# Patient Record
Sex: Male | Born: 1950 | Race: White | Hispanic: Yes | Smoking: Former smoker
Health system: Southern US, Community
[De-identification: ages and names within clinical notes are randomized; demographics above are authoritative.]

## PROBLEM LIST (undated history)

## (undated) DIAGNOSIS — K219 Gastro-esophageal reflux disease without esophagitis: Secondary | ICD-10-CM

## (undated) DIAGNOSIS — M549 Dorsalgia, unspecified: Secondary | ICD-10-CM

## (undated) DIAGNOSIS — R7309 Other abnormal glucose: Secondary | ICD-10-CM

## (undated) DIAGNOSIS — L0292 Furuncle, unspecified: Secondary | ICD-10-CM

## (undated) DIAGNOSIS — IMO0002 Reserved for concepts with insufficient information to code with codable children: Secondary | ICD-10-CM

## (undated) DIAGNOSIS — L0293 Carbuncle, unspecified: Secondary | ICD-10-CM

## (undated) DIAGNOSIS — J309 Allergic rhinitis, unspecified: Secondary | ICD-10-CM

## (undated) DIAGNOSIS — E785 Hyperlipidemia, unspecified: Secondary | ICD-10-CM

## (undated) DIAGNOSIS — E291 Testicular hypofunction: Secondary | ICD-10-CM

## (undated) DIAGNOSIS — G4733 Obstructive sleep apnea (adult) (pediatric): Secondary | ICD-10-CM

## (undated) DIAGNOSIS — E669 Obesity, unspecified: Secondary | ICD-10-CM

## (undated) DIAGNOSIS — E119 Type 2 diabetes mellitus without complications: Secondary | ICD-10-CM

## (undated) DIAGNOSIS — G4710 Hypersomnia, unspecified: Secondary | ICD-10-CM

## (undated) DIAGNOSIS — I10 Essential (primary) hypertension: Secondary | ICD-10-CM

## (undated) DIAGNOSIS — R05 Cough: Secondary | ICD-10-CM

## (undated) DIAGNOSIS — K645 Perianal venous thrombosis: Secondary | ICD-10-CM

## (undated) DIAGNOSIS — R7302 Impaired glucose tolerance (oral): Secondary | ICD-10-CM

## (undated) DIAGNOSIS — Z86010 Personal history of colonic polyps: Secondary | ICD-10-CM

## (undated) HISTORY — DX: Obesity, unspecified: E66.9

## (undated) HISTORY — DX: Type 2 diabetes mellitus without complications: E11.9

## (undated) HISTORY — DX: Furuncle, unspecified: L02.92

## (undated) HISTORY — DX: Gastro-esophageal reflux disease without esophagitis: K21.9

## (undated) HISTORY — DX: Carbuncle, unspecified: L02.93

## (undated) HISTORY — DX: Personal history of colonic polyps: Z86.010

## (undated) HISTORY — DX: Hyperlipidemia, unspecified: E78.5

## (undated) HISTORY — DX: Allergic rhinitis, unspecified: J30.9

## (undated) HISTORY — DX: Other abnormal glucose: R73.09

## (undated) HISTORY — DX: Testicular hypofunction: E29.1

## (undated) HISTORY — DX: Essential (primary) hypertension: I10

## (undated) HISTORY — DX: Obstructive sleep apnea (adult) (pediatric): G47.33

## (undated) HISTORY — DX: Hypersomnia, unspecified: G47.10

## (undated) HISTORY — DX: Perianal venous thrombosis: K64.5

## (undated) HISTORY — DX: Cough: R05

## (undated) HISTORY — DX: Dorsalgia, unspecified: M54.9

## (undated) HISTORY — DX: Impaired glucose tolerance (oral): R73.02

## (undated) HISTORY — DX: Reserved for concepts with insufficient information to code with codable children: IMO0002

---

## 2007-01-03 ENCOUNTER — Emergency Department (HOSPITAL_COMMUNITY): Admission: EM | Admit: 2007-01-03 | Discharge: 2007-01-03 | Payer: Self-pay | Admitting: Emergency Medicine

## 2007-02-01 ENCOUNTER — Ambulatory Visit: Payer: Self-pay | Admitting: Internal Medicine

## 2007-02-02 ENCOUNTER — Ambulatory Visit: Payer: Self-pay | Admitting: Cardiovascular Disease

## 2007-02-03 ENCOUNTER — Ambulatory Visit: Payer: Self-pay | Admitting: Internal Medicine

## 2007-02-03 LAB — CONVERTED CEMR LAB
ALT: 38 units/L (ref 0–40)
AST: 31 units/L (ref 0–37)
Albumin: 4.1 g/dL (ref 3.5–5.2)
BUN: 9 mg/dL (ref 6–23)
Basophils Absolute: 0 10*3/uL (ref 0.0–0.1)
Basophils Relative: 0 % (ref 0.0–1.0)
CO2: 31 meq/L (ref 19–32)
Calcium: 9.3 mg/dL (ref 8.4–10.5)
Chloride: 104 meq/L (ref 96–112)
Cholesterol: 212 mg/dL (ref 0–200)
Creatinine, Ser: 1 mg/dL (ref 0.4–1.5)
Direct LDL: 155.9 mg/dL
Eosinophils Absolute: 0.1 10*3/uL (ref 0.0–0.6)
Eosinophils Relative: 1.9 % (ref 0.0–5.0)
GFR calc Af Amer: 101 mL/min
GFR calc non Af Amer: 83 mL/min
Glucose, Bld: 114 mg/dL — ABNORMAL HIGH (ref 70–99)
HCT: 41.8 % (ref 39.0–52.0)
HDL: 48.1 mg/dL (ref >39.0)
Hemoglobin: 14.3 g/dL (ref 13.0–17.0)
Hgb A1c MFr Bld: 6.1 % — ABNORMAL HIGH (ref 4.6–6.0)
Lymphocytes Relative: 49.7 % — ABNORMAL HIGH (ref 12.0–46.0)
MCHC: 34.2 g/dL (ref 30.0–36.0)
MCV: 86.4 fL (ref 78.0–100.0)
Monocytes Absolute: 0.4 10*3/uL (ref 0.2–0.7)
Monocytes Relative: 6.9 % (ref 3.0–11.0)
Neutro Abs: 2.2 10*3/uL (ref 1.4–7.7)
Neutrophils Relative %: 41.5 % — ABNORMAL LOW (ref 43.0–77.0)
PSA: 2.24 ng/mL
PSA: 2.24 ng/mL (ref 0.10–4.00)
Platelets: 306 10*3/uL (ref 150–400)
Potassium: 4.5 meq/L (ref 3.5–5.1)
RBC: 4.84 M/uL (ref 4.22–5.81)
RDW: 12.7 % (ref 11.5–14.6)
Sodium: 140 meq/L (ref 135–145)
TSH: 1.78 microintl units/mL (ref 0.35–5.50)
Total CHOL/HDL Ratio: 4.4
Triglycerides: 91 mg/dL (ref 0–149)
VLDL: 18 mg/dL (ref 0–40)
WBC: 5.2 10*3/uL (ref 4.5–10.5)

## 2007-02-07 ENCOUNTER — Ambulatory Visit: Payer: Self-pay

## 2007-03-14 ENCOUNTER — Ambulatory Visit: Payer: Self-pay | Admitting: Internal Medicine

## 2007-04-18 ENCOUNTER — Ambulatory Visit: Payer: Self-pay | Admitting: Internal Medicine

## 2007-04-18 LAB — CONVERTED CEMR LAB
ALT: 34 units/L (ref 0–40)
AST: 30 units/L (ref 0–37)
Albumin: 4.2 g/dL (ref 3.5–5.2)
Alkaline Phosphatase: 32 units/L — ABNORMAL LOW (ref 39–117)
BUN: 16 mg/dL (ref 6–23)
Bilirubin, Direct: 0.1 mg/dL (ref 0.0–0.3)
CO2: 30 meq/L (ref 19–32)
Calcium: 9.6 mg/dL (ref 8.4–10.5)
Chloride: 107 meq/L (ref 96–112)
Cholesterol: 172 mg/dL (ref 0–200)
Creatinine, Ser: 1 mg/dL (ref 0.4–1.5)
GFR calc Af Amer: 101 mL/min
GFR calc non Af Amer: 83 mL/min
Glucose, Bld: 110 mg/dL — ABNORMAL HIGH (ref 70–99)
HDL: 42.5 mg/dL (ref >39.0)
LDL Cholesterol: 101 mg/dL — ABNORMAL HIGH (ref 0–99)
Potassium: 4.7 meq/L (ref 3.5–5.1)
Sodium: 142 meq/L (ref 135–145)
Testosterone: 249.79 ng/dL — ABNORMAL LOW (ref 350.00–890)
Total Bilirubin: 0.8 mg/dL (ref 0.3–1.2)
Total CHOL/HDL Ratio: 4
Total Protein: 6.9 g/dL (ref 6.0–8.3)
Triglycerides: 145 mg/dL (ref 0–149)
VLDL: 29 mg/dL (ref 0–40)

## 2007-04-25 ENCOUNTER — Ambulatory Visit: Payer: Self-pay | Admitting: Internal Medicine

## 2007-06-08 ENCOUNTER — Ambulatory Visit: Payer: Self-pay | Admitting: Internal Medicine

## 2007-06-08 LAB — CONVERTED CEMR LAB
BUN: 13 mg/dL (ref 6–23)
CO2: 27 meq/L (ref 19–32)
Calcium: 9.6 mg/dL (ref 8.4–10.5)
Chloride: 105 meq/L (ref 96–112)
Creatinine, Ser: 1 mg/dL (ref 0.4–1.5)
GFR calc Af Amer: 101 mL/min
GFR calc non Af Amer: 83 mL/min
Glucose, Bld: 103 mg/dL — ABNORMAL HIGH (ref 70–99)
Potassium: 4.4 meq/L (ref 3.5–5.1)
Sex Hormone Binding: 12 nmol/L — ABNORMAL LOW (ref 13–71)
Sodium: 141 meq/L (ref 135–145)
Testosterone Free: 81.1 pg/mL (ref 47.0–244.0)
Testosterone-% Free: 3 % — ABNORMAL HIGH (ref 1.6–2.9)
Testosterone: 266.35 ng/dL — ABNORMAL LOW (ref 350–890)

## 2007-06-13 ENCOUNTER — Ambulatory Visit: Payer: Self-pay | Admitting: Internal Medicine

## 2007-07-14 ENCOUNTER — Ambulatory Visit: Payer: Self-pay | Admitting: Internal Medicine

## 2007-11-22 ENCOUNTER — Ambulatory Visit: Payer: Self-pay | Admitting: Internal Medicine

## 2007-11-22 DIAGNOSIS — R7309 Other abnormal glucose: Secondary | ICD-10-CM | POA: Insufficient documentation

## 2007-11-22 HISTORY — DX: Other abnormal glucose: R73.09

## 2007-11-22 LAB — CONVERTED CEMR LAB
ALT: 40 units/L (ref 0–53)
AST: 33 units/L (ref 0–37)
Cholesterol: 183 mg/dL (ref 0–200)
HDL: 51.5 mg/dL (ref >39.0)
Hgb A1c MFr Bld: 6.1 % — ABNORMAL HIGH (ref 4.6–6.0)
LDL Cholesterol: 102 mg/dL — ABNORMAL HIGH (ref 0–99)
Total CHOL/HDL Ratio: 3.6
Triglycerides: 148 mg/dL (ref 0–149)
VLDL: 30 mg/dL (ref 0–40)

## 2007-11-23 ENCOUNTER — Ambulatory Visit: Payer: Self-pay | Admitting: Internal Medicine

## 2007-11-23 DIAGNOSIS — E785 Hyperlipidemia, unspecified: Secondary | ICD-10-CM

## 2007-11-23 DIAGNOSIS — E669 Obesity, unspecified: Secondary | ICD-10-CM

## 2007-11-23 DIAGNOSIS — I10 Essential (primary) hypertension: Secondary | ICD-10-CM

## 2007-11-23 DIAGNOSIS — Z860100 Personal history of colon polyps, unspecified: Secondary | ICD-10-CM | POA: Insufficient documentation

## 2007-11-23 DIAGNOSIS — K219 Gastro-esophageal reflux disease without esophagitis: Secondary | ICD-10-CM | POA: Insufficient documentation

## 2007-11-23 DIAGNOSIS — F528 Other sexual dysfunction not due to a substance or known physiological condition: Secondary | ICD-10-CM

## 2007-11-23 DIAGNOSIS — L0293 Carbuncle, unspecified: Secondary | ICD-10-CM

## 2007-11-23 DIAGNOSIS — L0292 Furuncle, unspecified: Secondary | ICD-10-CM | POA: Insufficient documentation

## 2007-11-23 HISTORY — DX: Personal history of colon polyps, unspecified: Z86.0100

## 2007-11-23 HISTORY — DX: Furuncle, unspecified: L02.92

## 2007-11-23 HISTORY — DX: Personal history of colonic polyps: Z86.010

## 2007-11-23 HISTORY — DX: Gastro-esophageal reflux disease without esophagitis: K21.9

## 2007-11-23 HISTORY — DX: Hyperlipidemia, unspecified: E78.5

## 2007-11-23 HISTORY — DX: Obesity, unspecified: E66.9

## 2007-11-23 HISTORY — DX: Essential (primary) hypertension: I10

## 2008-05-09 ENCOUNTER — Ambulatory Visit: Payer: Self-pay | Admitting: Internal Medicine

## 2008-05-09 LAB — CONVERTED CEMR LAB
ALT: 40 units/L (ref 0–53)
AST: 37 units/L (ref 0–37)
Albumin: 4.5 g/dL (ref 3.5–5.2)
Alkaline Phosphatase: 37 units/L — ABNORMAL LOW (ref 39–117)
BUN: 10 mg/dL (ref 6–23)
Bilirubin, Direct: 0.1 mg/dL (ref 0.0–0.3)
CO2: 30 meq/L (ref 19–32)
Calcium: 9.8 mg/dL (ref 8.4–10.5)
Chloride: 102 meq/L (ref 96–112)
Cholesterol: 171 mg/dL (ref 0–200)
Creatinine, Ser: 0.9 mg/dL (ref 0.4–1.5)
GFR calc Af Amer: 113 mL/min
GFR calc non Af Amer: 93 mL/min
Glucose, Bld: 116 mg/dL — ABNORMAL HIGH (ref 70–99)
HDL: 52 mg/dL (ref >39.0)
Hgb A1c MFr Bld: 6 % (ref 4.6–6.0)
LDL Cholesterol: 95 mg/dL (ref 0–99)
PSA: 3.2 ng/mL (ref 0.10–4.00)
Potassium: 4.5 meq/L (ref 3.5–5.1)
Sodium: 139 meq/L (ref 135–145)
Total Bilirubin: 1 mg/dL (ref 0.3–1.2)
Total CHOL/HDL Ratio: 3.3
Total Protein: 7.3 g/dL (ref 6.0–8.3)
Triglycerides: 118 mg/dL (ref 0–149)
VLDL: 24 mg/dL (ref 0–40)

## 2008-06-01 ENCOUNTER — Ambulatory Visit: Payer: Self-pay | Admitting: Internal Medicine

## 2008-06-01 DIAGNOSIS — E119 Type 2 diabetes mellitus without complications: Secondary | ICD-10-CM

## 2008-06-01 DIAGNOSIS — R972 Elevated prostate specific antigen [PSA]: Secondary | ICD-10-CM | POA: Insufficient documentation

## 2008-06-01 HISTORY — DX: Type 2 diabetes mellitus without complications: E11.9

## 2008-06-08 ENCOUNTER — Encounter: Payer: Self-pay | Admitting: Internal Medicine

## 2008-07-11 ENCOUNTER — Telehealth (INDEPENDENT_AMBULATORY_CARE_PROVIDER_SITE_OTHER): Payer: Self-pay | Admitting: *Deleted

## 2008-08-13 ENCOUNTER — Encounter: Payer: Self-pay | Admitting: Internal Medicine

## 2008-11-30 ENCOUNTER — Encounter: Payer: Self-pay | Admitting: Internal Medicine

## 2009-01-02 ENCOUNTER — Ambulatory Visit: Payer: Self-pay | Admitting: Internal Medicine

## 2009-01-02 LAB — CONVERTED CEMR LAB
BUN: 15 mg/dL (ref 6–23)
CO2: 32 meq/L (ref 19–32)
Calcium: 10 mg/dL (ref 8.4–10.5)
Chloride: 102 meq/L (ref 96–112)
Cholesterol: 163 mg/dL (ref 0–200)
Creatinine, Ser: 0.9 mg/dL (ref 0.4–1.5)
GFR calc Af Amer: 113 mL/min
GFR calc non Af Amer: 93 mL/min
Glucose, Bld: 97 mg/dL (ref 70–99)
HDL: 57 mg/dL (ref >39.0)
Hgb A1c MFr Bld: 6.1 % — ABNORMAL HIGH (ref 4.6–6.0)
LDL Cholesterol: 87 mg/dL (ref 0–99)
Potassium: 3.8 meq/L (ref 3.5–5.1)
Sodium: 141 meq/L (ref 135–145)
Total CHOL/HDL Ratio: 2.9
Triglycerides: 94 mg/dL (ref 0–149)
VLDL: 19 mg/dL (ref 0–40)

## 2009-01-03 LAB — CONVERTED CEMR LAB

## 2009-04-11 ENCOUNTER — Telehealth: Payer: Self-pay | Admitting: Internal Medicine

## 2009-04-11 ENCOUNTER — Telehealth (INDEPENDENT_AMBULATORY_CARE_PROVIDER_SITE_OTHER): Payer: Self-pay | Admitting: *Deleted

## 2009-04-16 ENCOUNTER — Encounter: Payer: Self-pay | Admitting: Internal Medicine

## 2009-06-27 ENCOUNTER — Ambulatory Visit: Payer: Self-pay | Admitting: Internal Medicine

## 2009-06-27 LAB — CONVERTED CEMR LAB
ALT: 32 units/L (ref 0–53)
AST: 32 units/L (ref 0–37)
Albumin: 4.5 g/dL (ref 3.5–5.2)
Alkaline Phosphatase: 34 units/L — ABNORMAL LOW (ref 39–117)
BUN: 16 mg/dL (ref 6–23)
Basophils Absolute: 0 10*3/uL (ref 0.0–0.1)
Basophils Relative: 0.2 % (ref 0.0–3.0)
Bilirubin Urine: NEGATIVE
Bilirubin, Direct: 0.1 mg/dL (ref 0.0–0.3)
CO2: 29 meq/L (ref 19–32)
Calcium: 9.5 mg/dL (ref 8.4–10.5)
Chloride: 105 meq/L (ref 96–112)
Cholesterol: 150 mg/dL (ref 0–200)
Creatinine, Ser: 0.9 mg/dL (ref 0.4–1.5)
Creatinine,U: 85.7 mg/dL
Eosinophils Absolute: 0.1 10*3/uL (ref 0.0–0.7)
Eosinophils Relative: 1.4 % (ref 0.0–5.0)
GFR calc non Af Amer: 92.81 mL/min (ref >60)
Glucose, Bld: 111 mg/dL — ABNORMAL HIGH (ref 70–99)
HCT: 41 % (ref 39.0–52.0)
HDL: 50.4 mg/dL (ref >39.00)
Hemoglobin, Urine: NEGATIVE
Hemoglobin: 14.3 g/dL (ref 13.0–17.0)
Hgb A1c MFr Bld: 5.9 % (ref 4.6–6.5)
Ketones, ur: NEGATIVE mg/dL
LDL Cholesterol: 88 mg/dL (ref 0–99)
Leukocytes, UA: NEGATIVE
Lymphocytes Relative: 44.3 % (ref 12.0–46.0)
Lymphs Abs: 2.7 10*3/uL (ref 0.7–4.0)
MCHC: 34.9 g/dL (ref 30.0–36.0)
MCV: 88.6 fL (ref 78.0–100.0)
Microalb Creat Ratio: 10.5 mg/g (ref 0.0–30.0)
Microalb, Ur: 0.9 mg/dL (ref 0.0–1.9)
Monocytes Absolute: 0.4 10*3/uL (ref 0.1–1.0)
Monocytes Relative: 7.4 % (ref 3.0–12.0)
Neutro Abs: 2.8 10*3/uL (ref 1.4–7.7)
Neutrophils Relative %: 46.7 % (ref 43.0–77.0)
Nitrite: NEGATIVE
PSA: 4.34 ng/mL — ABNORMAL HIGH (ref 0.10–4.00)
Platelets: 290 10*3/uL (ref 150.0–400.0)
Potassium: 4.4 meq/L (ref 3.5–5.1)
RBC: 4.62 M/uL (ref 4.22–5.81)
RDW: 12.7 % (ref 11.5–14.6)
Sodium: 140 meq/L (ref 135–145)
Specific Gravity, Urine: 1.015 (ref 1.000–1.030)
TSH: 2.01 microintl units/mL (ref 0.35–5.50)
Testosterone: 225.94 ng/dL — ABNORMAL LOW (ref 350.00–890.00)
Total Bilirubin: 0.8 mg/dL (ref 0.3–1.2)
Total CHOL/HDL Ratio: 3
Total Protein, Urine: NEGATIVE mg/dL
Total Protein: 7.3 g/dL (ref 6.0–8.3)
Triglycerides: 59 mg/dL (ref 0.0–149.0)
Urine Glucose: NEGATIVE mg/dL
Urobilinogen, UA: 0.2 (ref 0.0–1.0)
VLDL: 11.8 mg/dL (ref 0.0–40.0)
WBC: 6 10*3/uL (ref 4.5–10.5)
pH: 7 (ref 5.0–8.0)

## 2009-07-01 ENCOUNTER — Ambulatory Visit: Payer: Self-pay | Admitting: Internal Medicine

## 2009-07-01 DIAGNOSIS — G4710 Hypersomnia, unspecified: Secondary | ICD-10-CM

## 2009-07-01 HISTORY — DX: Hypersomnia, unspecified: G47.10

## 2009-07-02 ENCOUNTER — Telehealth: Payer: Self-pay | Admitting: Internal Medicine

## 2009-07-11 ENCOUNTER — Ambulatory Visit: Payer: Self-pay | Admitting: Pulmonary Disease

## 2009-07-11 DIAGNOSIS — G4733 Obstructive sleep apnea (adult) (pediatric): Secondary | ICD-10-CM

## 2009-07-11 HISTORY — DX: Obstructive sleep apnea (adult) (pediatric): G47.33

## 2009-07-16 ENCOUNTER — Telehealth: Payer: Self-pay | Admitting: Internal Medicine

## 2009-08-05 ENCOUNTER — Encounter: Payer: Self-pay | Admitting: Pulmonary Disease

## 2009-08-05 ENCOUNTER — Ambulatory Visit (HOSPITAL_BASED_OUTPATIENT_CLINIC_OR_DEPARTMENT_OTHER): Admission: RE | Admit: 2009-08-05 | Discharge: 2009-08-05 | Payer: Self-pay | Admitting: Pulmonary Disease

## 2009-08-20 ENCOUNTER — Ambulatory Visit: Payer: Self-pay | Admitting: Pulmonary Disease

## 2009-08-21 ENCOUNTER — Telehealth (INDEPENDENT_AMBULATORY_CARE_PROVIDER_SITE_OTHER): Payer: Self-pay | Admitting: *Deleted

## 2009-09-02 ENCOUNTER — Ambulatory Visit: Payer: Self-pay | Admitting: Pulmonary Disease

## 2009-09-21 ENCOUNTER — Encounter: Payer: Self-pay | Admitting: Pulmonary Disease

## 2010-01-01 ENCOUNTER — Ambulatory Visit: Payer: Self-pay | Admitting: Internal Medicine

## 2010-01-01 LAB — CONVERTED CEMR LAB
BUN: 13 mg/dL (ref 6–23)
CO2: 31 meq/L (ref 19–32)
Calcium: 9.7 mg/dL (ref 8.4–10.5)
Chloride: 103 meq/L (ref 96–112)
Cholesterol: 191 mg/dL (ref 0–200)
Creatinine, Ser: 1 mg/dL (ref 0.4–1.5)
GFR calc non Af Amer: 82.03 mL/min (ref >60)
Glucose, Bld: 115 mg/dL — ABNORMAL HIGH (ref 70–99)
HDL: 63.2 mg/dL (ref >39.00)
Hgb A1c MFr Bld: 6.2 % (ref 4.6–6.5)
LDL Cholesterol: 107 mg/dL — ABNORMAL HIGH (ref 0–99)
Potassium: 4.8 meq/L (ref 3.5–5.1)
Sodium: 139 meq/L (ref 135–145)
Total CHOL/HDL Ratio: 3
Triglycerides: 102 mg/dL (ref 0.0–149.0)
VLDL: 20.4 mg/dL (ref 0.0–40.0)

## 2010-01-02 ENCOUNTER — Ambulatory Visit: Payer: Self-pay | Admitting: Internal Medicine

## 2010-01-02 DIAGNOSIS — R059 Cough, unspecified: Secondary | ICD-10-CM

## 2010-01-02 DIAGNOSIS — R05 Cough: Secondary | ICD-10-CM

## 2010-01-02 HISTORY — DX: Cough, unspecified: R05.9

## 2010-05-28 ENCOUNTER — Ambulatory Visit: Payer: Self-pay | Admitting: Internal Medicine

## 2010-05-28 DIAGNOSIS — M549 Dorsalgia, unspecified: Secondary | ICD-10-CM

## 2010-05-28 HISTORY — DX: Dorsalgia, unspecified: M54.9

## 2010-06-27 ENCOUNTER — Ambulatory Visit: Payer: Self-pay | Admitting: Internal Medicine

## 2010-06-27 ENCOUNTER — Encounter (INDEPENDENT_AMBULATORY_CARE_PROVIDER_SITE_OTHER): Payer: Self-pay | Admitting: *Deleted

## 2010-06-27 LAB — CONVERTED CEMR LAB
ALT: 49 units/L (ref 0–53)
AST: 45 units/L — ABNORMAL HIGH (ref 0–37)
Albumin: 4.5 g/dL (ref 3.5–5.2)
Alkaline Phosphatase: 38 units/L — ABNORMAL LOW (ref 39–117)
BUN: 11 mg/dL (ref 6–23)
Basophils Absolute: 0 10*3/uL (ref 0.0–0.1)
Basophils Relative: 0.6 % (ref 0.0–3.0)
Bilirubin Urine: NEGATIVE
Bilirubin, Direct: 0.1 mg/dL (ref 0.0–0.3)
CO2: 30 meq/L (ref 19–32)
Calcium: 9.8 mg/dL (ref 8.4–10.5)
Chloride: 101 meq/L (ref 96–112)
Cholesterol: 158 mg/dL (ref 0–200)
Creatinine, Ser: 1 mg/dL (ref 0.4–1.5)
Eosinophils Absolute: 0.1 10*3/uL (ref 0.0–0.7)
Eosinophils Relative: 1.7 % (ref 0.0–5.0)
GFR calc non Af Amer: 83.82 mL/min (ref >60)
Glucose, Bld: 101 mg/dL — ABNORMAL HIGH (ref 70–99)
HCT: 41.5 % (ref 39.0–52.0)
HDL: 50.2 mg/dL (ref >39.00)
Hemoglobin, Urine: NEGATIVE
Hemoglobin: 14.3 g/dL (ref 13.0–17.0)
Ketones, ur: NEGATIVE mg/dL
LDL Cholesterol: 85 mg/dL (ref 0–99)
Leukocytes, UA: NEGATIVE
Lymphocytes Relative: 39.3 % (ref 12.0–46.0)
Lymphs Abs: 2.5 10*3/uL (ref 0.7–4.0)
MCHC: 34.4 g/dL (ref 30.0–36.0)
MCV: 89.5 fL (ref 78.0–100.0)
Monocytes Absolute: 0.4 10*3/uL (ref 0.1–1.0)
Monocytes Relative: 6.7 % (ref 3.0–12.0)
Neutro Abs: 3.3 10*3/uL (ref 1.4–7.7)
Neutrophils Relative %: 51.7 % (ref 43.0–77.0)
Nitrite: NEGATIVE
PSA: 3.84 ng/mL (ref 0.10–4.00)
Platelets: 298 10*3/uL (ref 150.0–400.0)
Potassium: 5.2 meq/L — ABNORMAL HIGH (ref 3.5–5.1)
RBC: 4.63 M/uL (ref 4.22–5.81)
RDW: 13.3 % (ref 11.5–14.6)
Sodium: 140 meq/L (ref 135–145)
Specific Gravity, Urine: 1.01 (ref 1.000–1.030)
TSH: 1.78 microintl units/mL (ref 0.35–5.50)
Testosterone: 220.17 ng/dL — ABNORMAL LOW (ref 350.00–890.00)
Total Bilirubin: 0.8 mg/dL (ref 0.3–1.2)
Total CHOL/HDL Ratio: 3
Total Protein, Urine: NEGATIVE mg/dL
Total Protein: 7.1 g/dL (ref 6.0–8.3)
Triglycerides: 113 mg/dL (ref 0.0–149.0)
Urine Glucose: NEGATIVE mg/dL
Urobilinogen, UA: 0.2 (ref 0.0–1.0)
VLDL: 22.6 mg/dL (ref 0.0–40.0)
WBC: 6.3 10*3/uL (ref 4.5–10.5)
pH: 7 (ref 5.0–8.0)

## 2010-07-02 ENCOUNTER — Encounter: Payer: Self-pay | Admitting: Internal Medicine

## 2010-07-02 ENCOUNTER — Ambulatory Visit: Payer: Self-pay | Admitting: Internal Medicine

## 2010-07-02 DIAGNOSIS — E291 Testicular hypofunction: Secondary | ICD-10-CM

## 2010-07-02 HISTORY — DX: Testicular hypofunction: E29.1

## 2010-07-07 ENCOUNTER — Telehealth (INDEPENDENT_AMBULATORY_CARE_PROVIDER_SITE_OTHER): Payer: Self-pay | Admitting: *Deleted

## 2010-07-08 ENCOUNTER — Encounter: Payer: Self-pay | Admitting: Internal Medicine

## 2010-09-02 ENCOUNTER — Encounter: Payer: Self-pay | Admitting: Internal Medicine

## 2010-09-11 ENCOUNTER — Ambulatory Visit: Payer: Self-pay | Admitting: Internal Medicine

## 2010-09-11 DIAGNOSIS — K645 Perianal venous thrombosis: Secondary | ICD-10-CM | POA: Insufficient documentation

## 2010-09-11 HISTORY — DX: Perianal venous thrombosis: K64.5

## 2010-09-16 ENCOUNTER — Ambulatory Visit (HOSPITAL_COMMUNITY): Admission: RE | Admit: 2010-09-16 | Discharge: 2010-09-16 | Payer: Self-pay | Admitting: Internal Medicine

## 2010-09-17 ENCOUNTER — Encounter: Payer: Self-pay | Admitting: Internal Medicine

## 2010-09-17 DIAGNOSIS — IMO0002 Reserved for concepts with insufficient information to code with codable children: Secondary | ICD-10-CM

## 2010-09-17 HISTORY — DX: Reserved for concepts with insufficient information to code with codable children: IMO0002

## 2010-10-15 ENCOUNTER — Encounter: Payer: Self-pay | Admitting: Internal Medicine

## 2010-11-26 ENCOUNTER — Telehealth (INDEPENDENT_AMBULATORY_CARE_PROVIDER_SITE_OTHER): Payer: Self-pay | Admitting: *Deleted

## 2010-11-27 ENCOUNTER — Ambulatory Visit (HOSPITAL_COMMUNITY)
Admission: RE | Admit: 2010-11-27 | Discharge: 2010-11-27 | Payer: Self-pay | Source: Home / Self Care | Attending: Orthopaedic Surgery | Admitting: Orthopaedic Surgery

## 2010-12-01 LAB — BASIC METABOLIC PANEL
BUN: 9 mg/dL (ref 6–23)
CO2: 25 mEq/L (ref 19–32)
Calcium: 9.9 mg/dL (ref 8.4–10.5)
Chloride: 103 mEq/L (ref 96–112)
Creatinine, Ser: 1.07 mg/dL (ref 0.4–1.5)
GFR calc Af Amer: >60 mL/min (ref >60)
GFR calc non Af Amer: >60 mL/min (ref >60)
Glucose, Bld: 94 mg/dL (ref 70–99)
Potassium: 4.5 mEq/L (ref 3.5–5.1)
Sodium: 138 mEq/L (ref 135–145)

## 2010-12-01 LAB — CBC
HCT: 43 % (ref 39.0–52.0)
Hemoglobin: 15.1 g/dL (ref 13.0–17.0)
MCH: 29.4 pg (ref 26.0–34.0)
MCHC: 35.1 g/dL (ref 30.0–36.0)
MCV: 83.8 fL (ref 78.0–100.0)
Platelets: 298 10*3/uL (ref 150–400)
RBC: 5.13 MIL/uL (ref 4.22–5.81)
RDW: 13.3 % (ref 11.5–15.5)
WBC: 8.7 10*3/uL (ref 4.0–10.5)

## 2010-12-01 LAB — PROTIME-INR
INR: 0.99 (ref 0.00–1.49)
Prothrombin Time: 13.3 seconds (ref 11.6–15.2)

## 2010-12-01 LAB — GLUCOSE, CAPILLARY
Glucose-Capillary: 119 mg/dL — ABNORMAL HIGH (ref 70–99)
Glucose-Capillary: 122 mg/dL — ABNORMAL HIGH (ref 70–99)

## 2010-12-01 LAB — SURGICAL PCR SCREEN
MRSA, PCR: NEGATIVE
Staphylococcus aureus: NEGATIVE

## 2010-12-16 NOTE — Medication Information (Signed)
Summary: Prior Autho & Approved for Androgel/[employer]  Prior Autho & Approved for Androgel/[employer]   Imported By: Sherian Rein 07/11/2010 13:38:03  _____________________________________________________________________  External Attachment:    Type:   Image     Comment:   External Document

## 2010-12-16 NOTE — Assessment & Plan Note (Signed)
Summary: back pain/#/cd   Vital Signs:  Patient profile:   60 year old male Height:      70 inches Weight:      263 pounds BMI:     37.87 O2 Sat:      94 % on Room air Temp:     98.4 degrees F oral Pulse rate:   92 / minute BP sitting:   112 / 80  (left arm) Cuff size:   large  Vitals Entered By: Zella Ball Ewing CMA Duncan Dull) (May 28, 2010 3:11 PM)  O2 Flow:  Room air CC: Back Pain for 1 week/RE   Primary Care Provider:  Corwin Levins MD  CC:  Back Pain for 1 week/RE.  History of Present Illness: her with acute onset midl to mod lower back pain, had minor pain on vacation in may, but recurred similar but more severe and persistent;  intermittent in the in the AM pain is usually improved, but later in the afternoon can be severe; more on right with radiuation to the right lateral thigh, has some achingpain to the post right knee occasinaly, also with some numbness  but no weakness, no gait change, falls, or other injury, worse to stand for 10 min,  ok with walking in from the parking lot today;  no bowel or bladder change except for minor slow flow 3 wks ago now resolved;  no fever, unusaul wt loss, night sweats, or other constitutional symptoms.  Pt denies polydipsia, polyuria, or low sugar symptoms such as shakiness improved with eating.  Overall good compliance with meds, trying to follow low chol, DM diet, wt stable, little excercise however CBG's in the low 100's  Problems Prior to Update: 1)  Back Pain  (ICD-724.5) 2)  Cough  (ICD-786.2) 3)  Obstructive Sleep Apnea  (ICD-327.23) 4)  Hypersomnia  (ICD-780.54) 5)  Sexually Transmitted Disease, Exposure To  (ICD-V01.6) 6)  Gerd  (ICD-530.81) 7)  Diabetes Mellitus, Type II  (ICD-250.00) 8)  Psa, Increased  (ICD-790.93) 9)  Preventive Health Care  (ICD-V70.0) 10)  Boils, Recurrent  (ICD-680.9) 11)  Erectile Dysfunction  (ICD-302.72) 12)  Hyperlipidemia  (ICD-272.4) 13)  Hypertension  (ICD-401.9) 14)  Obesity, Unspecified   (ICD-278.00) 15)  Colonic Polyps, Hx of  (ICD-V12.72) 16)  Gastroesophageal Reflux Disease  (ICD-530.81) 17)  Other Abnormal Glucose  (ICD-790.29)  Medications Prior to Update: 1)  Prilosec 20 Mg  Cpdr (Omeprazole) .... Take 1 Tablet By Mouth Once A Day 2)  Simvastatin 20 Mg  Tabs (Simvastatin) .... Take 1 Tablet By Mouth Once A Day 3)  Glucophage Xr 500 Mg  Tb24 (Metformin Hcl) .... One By Mouth Two Times A Day 4)  Amlodipine Besy-Benazepril Hcl 5-20 Mg Caps (Amlodipine Besy-Benazepril Hcl) .Marland Kitchen.. 1po Once Daily 5)  Cialis 20 Mg  Tabs (Tadalafil) .Marland Kitchen.. 1 By Mouth Once Daily As Needed 6)  Bactroban 2 %  Oint (Mupirocin) .... Use As Directed 7)  Adult Aspirin Ec Low Strength 81 Mg  Tbec (Aspirin) .Marland Kitchen.. 1 By Mouth Once Daily 8)  Valtrex 1 Gm Tabs (Valacyclovir Hcl) .Marland Kitchen.. 1 By Mouth Two Times A Day  Current Medications (verified): 1)  Prilosec 20 Mg  Cpdr (Omeprazole) .... Take 1 Tablet By Mouth Once A Day 2)  Simvastatin 20 Mg  Tabs (Simvastatin) .... Take 1 Tablet By Mouth Once A Day 3)  Glucophage Xr 500 Mg  Tb24 (Metformin Hcl) .... One By Mouth Two Times A Day 4)  Amlodipine Besy-Benazepril Hcl 5-20 Mg  Caps (Amlodipine Besy-Benazepril Hcl) .Marland Kitchen.. 1po Once Daily 5)  Cialis 20 Mg  Tabs (Tadalafil) .Marland Kitchen.. 1 By Mouth Once Daily As Needed 6)  Bactroban 2 %  Oint (Mupirocin) .... Use As Directed 7)  Adult Aspirin Ec Low Strength 81 Mg  Tbec (Aspirin) .Marland Kitchen.. 1 By Mouth Once Daily 8)  Valtrex 1 Gm Tabs (Valacyclovir Hcl) .Marland Kitchen.. 1 By Mouth Two Times A Day 9)  Hydrocodone-Acetaminophen 7.5-325 Mg Tabs (Hydrocodone-Acetaminophen) .Marland Kitchen.. 1 By Mouth Q 6 Hrs As Needed Pain 10)  Prednisone 10 Mg Tabs (Prednisone) .... 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 Former Smoker - started at age 5.  less than 1 ppd.  quit 1993. Alcohol use-yes (seldom) pt lives with his daughter.    Risk Factors: Smoking Status: quit (11/23/2007)  Review of Systems       all otherwise negative per pt -    Physical Exam  General:  alert and overweight-appearing.   Head:  normocephalic and atraumatic.   Eyes:  vision grossly intact, pupils equal, and pupils round.   Ears:  R ear normal and L ear normal.   Nose:  no external deformity and no nasal discharge.   Mouth:  no gingival abnormalities and pharynx pink and moist.   Neck:  supple and no masses.   Lungs:  normal respiratory effort and normal breath sounds.   Heart:  normal rate and regular rhythm.   Msk:  no joint tenderness and no joint swelling.  , spine nontender,  paravertebral area nontender lumbar area , no SI joint area tender, no rash or swelling Extremities:  no edema, no erythema  Neurologic:  cranial nerves II-XII intact, strength normal in all extremities, sensation intact to light touch, and gait normal.   Skin:  no rashes.   Psych:  not depressed appearing and slightly anxious.     Impression & Recommendations:  Problem # 1:  BACK PAIN (ICD-724.5)  His updated medication list for this problem includes:    Adult Aspirin Ec Low Strength 81 Mg Tbec (Aspirin) .Marland Kitchen... 1 by mouth once daily    Hydrocodone-acetaminophen 7.5-325 Mg Tabs (Hydrocodone-acetaminophen) .Marland Kitchen... 1 by mouth q 6 hrs as needed pain cant completly r/o sciatica, but suspect more liely flare of lumbar djd, ddd - for pain control, and prednisone pack wit taper off. hold on imaging at this time;  consider ortho if worse  Problem # 2:   DIABETES MELLITUS, TYPE II (ICD-250.00)  His updated medication list for this problem includes:    Glucophage Xr 500 Mg Tb24 (Metformin hcl) ..... One by mouth two times a day    Amlodipine Besy-benazepril Hcl 5-20 Mg Caps (Amlodipine besy-benazepril hcl) .Marland Kitchen... 1po once daily    Adult Aspirin Ec Low Strength 81 Mg Tbec (Aspirin) .Marland Kitchen... 1 by mouth once daily  Labs Reviewed: Creat: 1.0 (01/01/2010)    Reviewed HgBA1c results: 6.2 (01/01/2010)  5.9 (06/27/2009) stable overall by hx and exam, ok to continue meds/tx as is , Pt to cont DM diet,  excercise, wt loss efforts; to check labs with visit next month  Problem # 3:  HYPERTENSION (ICD-401.9)  His updated medication list for this problem includes:    Amlodipine Besy-benazepril Hcl 5-20 Mg Caps (Amlodipine besy-benazepril hcl) .Marland Kitchen... 1po once daily  BP today: 112/80 Prior BP: 122/68 (01/02/2010)  Labs Reviewed: K+: 4.8 (01/01/2010) Creat: : 1.0 (01/01/2010)   Chol: 191 (01/01/2010)   HDL: 63.20 (01/01/2010)   LDL: 107 (01/01/2010)   TG: 102.0 (01/01/2010) stable overall by hx and exam, ok to continue meds/tx as is   Complete Medication List: 1)  Prilosec 20 Mg Cpdr (Omeprazole) .... Take 1 tablet by mouth once a day 2)  Simvastatin 20 Mg Tabs (Simvastatin) .... Take 1 tablet by mouth once a day 3)  Glucophage Xr 500 Mg Tb24 (Metformin hcl) .... One by mouth two times a day 4)  Amlodipine Besy-benazepril Hcl 5-20 Mg Caps (Amlodipine besy-benazepril hcl) .Marland Kitchen.. 1po once daily 5)  Cialis 20 Mg Tabs (Tadalafil) .Marland Kitchen.. 1 by mouth once daily as needed 6)  Bactroban 2 % Oint (Mupirocin) .... Use as directed 7)  Adult Aspirin Ec Low Strength 81 Mg Tbec (Aspirin) .Marland Kitchen.. 1 by mouth once daily 8)  Valtrex 1 Gm Tabs (Valacyclovir hcl) .Marland Kitchen.. 1 by mouth two times a day 9)  Hydrocodone-acetaminophen 7.5-325 Mg Tabs (Hydrocodone-acetaminophen) .Marland Kitchen.. 1 by mouth q 6 hrs as needed pain 10)  Prednisone 10 Mg Tabs (Prednisone) .... 4po qd for 3days, then 3po qd  for 3days, then 2po qd for 3days, then 1po qd for 3 days, then stop  Patient Instructions: 1)  Please take all new medications as prescribed 2)  Continue all previous medications as before this visit  3)  remember, the prednisone will make your blood sugar mildly increased;  you can take an extra metformin if thiis occurs 4)  Please schedule a follow-up appointment at your physical as planned, or sooner if needed Prescriptions: PREDNISONE 10 MG TABS (PREDNISONE) 4po qd for 3days, then 3po qd for 3days, then 2po qd for 3days, then 1po qd for 3 days, then stop  #30 x 0   Entered and Authorized by:   Corwin Levins MD   Signed by:   Corwin Levins MD on 05/28/2010   Method used:   Print then Give to Patient   RxID:   [id number] HYDROCODONE-ACETAMINOPHEN 7.5-325 MG TABS (HYDROCODONE-ACETAMINOPHEN) 1 by mouth q 6 hrs as needed pain  #50 x 0   Entered and Authorized by:   Corwin Levins MD   Signed by:   Corwin Levins MD on 05/28/2010   Method used:   Print then Give to Patient   RxID:   [id number] VALTREX 1 GM TABS (VALACYCLOVIR HCL) 1 by mouth two times a day  #20 x 1   Entered and Authorized by:   Corwin Levins MD   Signed by:   Corwin Levins MD on 05/28/2010   Method used:   Print then Give to Patient   RxID:   [id number]

## 2010-12-16 NOTE — Miscellaneous (Signed)
Summary: Orders Update  Clinical Lists Changes  Problems: Added new problem of LUMBAR RADICULOPATHY, RIGHT (ICD-724.4) Orders: Added new Referral order of Neurosurgeon Referral (Neurosurgeon) - Signed

## 2010-12-16 NOTE — Medication Information (Signed)
Summary: Prior autho & approved for Omeprazole/[facility]  Prior autho & approved for Omeprazole/[facility]   Imported By: Sherian Rein 07/11/2010 13:39:27  _____________________________________________________________________  External Attachment:    Type:   Image     Comment:   External Document

## 2010-12-16 NOTE — Miscellaneous (Signed)
Summary: Immunization Entry   Immunization History:  Influenza Immunization History:    Influenza:  historical (09/02/2010)  [facility] [address] [facility]. [place] Vaccine, Fluzone Dose 0.23ml Site, Left Deltoid, IM Mfg. [employer] Date Administered, 09/02/2010 Lot # [id number]

## 2010-12-16 NOTE — Progress Notes (Signed)
Summary: PA-Androgel and Omeprazole   Phone Note From Pharmacy   Summary of Call: PA-Called [employer] awaiting forms for Androgel-case # [id number] and Omeprazole case # [id number]. Dagoberto Reef  July 07, 2010 3:39 PM  Androgel and Omeprazole faxed to [facility] @ [phone], awaiting approval. Initial call taken by: Dagoberto Reef,  July 08, 2010 4:11 PM  Follow-up for Phone Call        Andergel approved 06/17/10-07/07/15, case # [id number] and Omeprazole approved 06/17/10-07/08/11, case # [id number], pt aware. Follow-up by: Dagoberto Reef,  July 09, 2010 2:57 PM

## 2010-12-16 NOTE — Assessment & Plan Note (Signed)
Summary: 6 MTH PHYSICAL--STC   Vital Signs:  Patient profile:   60 year old male Height:      70 inches Weight:      260.25 pounds BMI:     37.48 O2 Sat:      96 % on Room air Temp:     98.7 degrees F oral Pulse rate:   76 / minute BP sitting:   124 / 70  (left arm) Cuff size:   large  Vitals Entered By: Zella Ball Ewing CMA Duncan Dull) (July 02, 2010 10:42 AM)  O2 Flow:  Room air  CC: Adult Physical/RE   Primary Care Yariana Hoaglund:  Corwin Levins MD  CC:  Adult Physical/RE.  History of Present Illness: here to f/u -= back pain imprved, but can still only stand 5 to 10 moin before pain starts again, better to sit for 3 min and then get back up; he is pleased with the improvement, and no bowel or bladder cnahge, but sitll with numbness to the RIGHT thigh area and occasionally below the knee;  has been riding his bike for 3 wks and does not make wore;  ellipitical machine likewise does not make worse;  no falls or injury  Pt wants to wathc further for now to see if more impoved; Pt denies CP, sob, doe, wheezing, orthopnea, pnd, worsening LE edema, palps, dizziness or syncope  Pt denies new neuro symptoms such as headache, facial or extremity weakness  Pt denies polydipsia, polyuria, or low sugar symptoms such as shakiness improved with eating.  Overall good compliance with meds, trying to follow low chol, DM diet, wt stable, little excercise however except for the above  Problems Prior to Update: 1)  Hypogonadism  (ICD-257.2) 2)  Back Pain  (ICD-724.5) 3)  Cough  (ICD-786.2) 4)  Obstructive Sleep Apnea  (ICD-327.23) 5)  Hypersomnia  (ICD-780.54) 6)  Sexually Transmitted Disease, Exposure To  (ICD-V01.6) 7)  Gerd  (ICD-530.81) 8)  Diabetes Mellitus, Type II  (ICD-250.00) 9)  Psa, Increased  (ICD-790.93) 10)  Preventive Health Care  (ICD-V70.0) 11)  Boils, Recurrent  (ICD-680.9) 12)  Erectile Dysfunction  (ICD-302.72) 13)  Hyperlipidemia  (ICD-272.4) 14)  Hypertension  (ICD-401.9) 15)   Obesity, Unspecified  (ICD-278.00) 16)  Colonic Polyps, Hx of  (ICD-V12.72) 17)  Gastroesophageal Reflux Disease  (ICD-530.81) 18)  Other Abnormal Glucose  (ICD-790.29)  Medications Prior to Update: 1)  Prilosec 20 Mg  Cpdr (Omeprazole) .... Take 1 Tablet By Mouth Once A Day 2)  Simvastatin 20 Mg  Tabs (Simvastatin) .... Take 1 Tablet By Mouth Once A Day 3)  Glucophage Xr 500 Mg  Tb24 (Metformin Hcl) .... One By Mouth Two Times A Day 4)  Amlodipine Besy-Benazepril Hcl 5-20 Mg Caps (Amlodipine Besy-Benazepril Hcl) .Marland Kitchen.. 1po Once Daily 5)  Cialis 20 Mg  Tabs (Tadalafil) .Marland Kitchen.. 1 By Mouth Once Daily As Needed 6)  Bactroban 2 %  Oint (Mupirocin) .... Use As Directed 7)  Adult Aspirin Ec Low Strength 81 Mg  Tbec (Aspirin) .Marland Kitchen.. 1 By Mouth Once Daily 8)  Valtrex 1 Gm Tabs (Valacyclovir Hcl) .Marland Kitchen.. 1 By Mouth Two Times A Day 9)  Hydrocodone-Acetaminophen 7.5-325 Mg Tabs (Hydrocodone-Acetaminophen) .Marland Kitchen.. 1 By Mouth Q 6 Hrs As Needed Pain 10)  Prednisone 10 Mg Tabs (Prednisone) .... 4po Qd For 3days, Then 3po Qd For 3days, Then 2po Qd For 3days, Then 1po Qd For 3 Days, Then Stop  Current Medications (verified): 1)  Prilosec 20 Mg  Cpdr (Omeprazole) .Marland KitchenMarland KitchenMarland Kitchen  Take 1 Tablet By Mouth Once A Day 2)  Simvastatin 20 Mg  Tabs (Simvastatin) .... Take 1 Tablet By Mouth Once A Day 3)  Glucophage Xr 500 Mg  Tb24 (Metformin Hcl) .... One By Mouth Two Times A Day 4)  Amlodipine Besy-Benazepril Hcl 5-20 Mg Caps (Amlodipine Besy-Benazepril Hcl) .Marland Kitchen.. 1po Once Daily 5)  Cialis 20 Mg  Tabs (Tadalafil) .Marland Kitchen.. 1 By Mouth Once Daily As Needed 6)  Bactroban 2 %  Oint (Mupirocin) .... Use As Directed 7)  Adult Aspirin Ec Low Strength 81 Mg  Tbec (Aspirin) .Marland Kitchen.. 1 By Mouth Once Daily 8)  Valtrex 1 Gm Tabs (Valacyclovir Hcl) .Marland Kitchen.. 1 By Mouth Two Times A Day 9)  Hydrocodone-Acetaminophen 7.5-325 Mg Tabs (Hydrocodone-Acetaminophen) .Marland Kitchen.. 1 By Mouth Q 6 Hrs As Needed Pain 10)  Androgel 50 Mg/5gm Gel (Testosterone) .Marland Kitchen.. 1pk Once Daily - Use  Asd  Allergies (verified): No Known Drug Allergies  Past History:  Past Surgical History: Last updated: 06/01/2008 Denies surgical history  Family History: Last updated: 07/11/2009 Father has hypertension high cholesterol. He denies family history of coronary artery disease.    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 Former Smoker - started at age 16.  less than 1 ppd.  quit 1993. Alcohol use-yes (seldom) pt lives with his daughter.    Risk Factors: Smoking Status: quit (11/23/2007)  Past Medical History: Hypertension Hyperlipidemia Erectile dysfunction Atypical chest pain - negative treadmill Myoview 4/08 Diabetes mellitus, type II Colonic polyps, hx of GERD PSA increased s/p- neg biopsy fall 2009  Review of Systems  The patient denies anorexia, fever, vision loss, decreased hearing, hoarseness, chest pain, syncope, dyspnea on exertion, peripheral edema, prolonged cough, headaches, hemoptysis, abdominal pain, melena, hematochezia, severe indigestion/heartburn, hematuria, muscle weakness, suspicious skin lesions, transient blindness, difficulty walking, depression, unusual weight change, abnormal bleeding, enlarged lymph nodes, and angioedema.         all otherwise negative per pt -    Physical Exam  General:  alert and overweight-appearing.   Head:  normocephalic and atraumatic.   Eyes:  vision grossly intact, pupils equal, and pupils round.   Ears:  R ear normal and L ear normal.   Nose:  no external deformity and no nasal discharge.   Mouth:  no gingival abnormalities and pharynx pink and moist.   Neck:  supple and no masses.   Lungs:  normal respiratory effort and normal breath sounds.   Heart:  normal rate and regular rhythm.   Abdomen:  soft, non-tender, and normal bowel sounds.   Msk:  no joint tenderness and no joint swelling.     Extremities:  no edema, no erythema  Neurologic:  cranial nerves II-XII intact and strength normal in all extremities.   Skin:  color normal and no rashes.   Psych:  memory intact for recent and remote and normally interactive.     Impression & Recommendations:  Problem # 1:  PREVENTIVE HEALTH CARE (ICD-V70.0)  Overall doing well, age appropriate education and counseling updated and referral for appropriate preventive services done unless declined, immunizations up to date or declined, diet counseling done if overweight, urged to quit smoking if smokes , most recent labs reviewed and current ordered if appropriate, ecg reviewed or declined (interpretation per ECG scanned in the EMR if done); information regarding Medicare Prevention requirements given if appropriate; speciality referrals updated as appropriate   Orders: EKG w/ Interpretation (93000)  Problem # 2:  HYPOGONADISM (ICD-257.2) for androgel tx  Problem # 3:  DIABETES MELLITUS, TYPE II (ICD-250.00)  His updated medication list for this problem includes:    Glucophage Xr 500 Mg Tb24 (Metformin hcl) ..... One by mouth two times a day    Amlodipine Besy-benazepril Hcl 5-20 Mg Caps (Amlodipine besy-benazepril hcl) .Marland Kitchen... 1po once daily    Adult Aspirin Ec Low Strength 81 Mg Tbec (Aspirin) .Marland Kitchen... 1 by mouth once daily  Labs Reviewed: Creat: 1.0 (06/27/2010)    Reviewed HgBA1c results: 6.2 (01/01/2010)  5.9 (06/27/2009) stable overall by hx and exam, ok to continue meds/tx as is   Problem # 4:  HYPERTENSION (ICD-401.9)  His updated medication list for this problem includes:    Amlodipine Besy-benazepril Hcl 5-20 Mg Caps (Amlodipine besy-benazepril hcl) .Marland Kitchen... 1po once daily  BP today: 124/70 Prior BP: 112/80 (05/28/2010)  Labs Reviewed: K+: 5.2 (06/27/2010) Creat: : 1.0 (06/27/2010)   Chol: 158 (06/27/2010)   HDL: 50.20 (06/27/2010)   LDL: 85 (06/27/2010)   TG: 113.0 (06/27/2010) stable overall by hx and exam, ok to  continue meds/tx as is   Complete Medication List: 1)  Prilosec 20 Mg Cpdr (Omeprazole) .... Take 1 tablet by mouth once a day 2)  Simvastatin 20 Mg Tabs (Simvastatin) .... Take 1 tablet by mouth once a day 3)  Glucophage Xr 500 Mg Tb24 (Metformin hcl) .... One by mouth two times a day 4)  Amlodipine Besy-benazepril Hcl 5-20 Mg Caps (Amlodipine besy-benazepril hcl) .Marland Kitchen.. 1po once daily 5)  Cialis 20 Mg Tabs (Tadalafil) .Marland Kitchen.. 1 by mouth once daily as needed 6)  Bactroban 2 % Oint (Mupirocin) .... Use as directed 7)  Adult Aspirin Ec Low Strength 81 Mg Tbec (Aspirin) .Marland Kitchen.. 1 by mouth once daily 8)  Valtrex 1 Gm Tabs (Valacyclovir hcl) .Marland Kitchen.. 1 by mouth two times a day 9)  Hydrocodone-acetaminophen 7.5-325 Mg Tabs (Hydrocodone-acetaminophen) .Marland Kitchen.. 1 by mouth q 6 hrs as needed pain 10)  Androgel 50 Mg/5gm Gel (Testosterone) .Marland Kitchen.. 1pk once daily - use asd  Patient Instructions: 1)  Please call if still having the back pain in 1-2 mo for orthopedic referral 2)  Please take all new medications as prescribed  - the gel for testosterone 3)  Continue all previous medications as before this visit  4)  Please schedule a follow-up appointment in 6 months with: 5)  BMP prior to visit, ICD-9: 250.02 6)  Lipid Panel prior to visit, ICD-9: 7)  HbgA1C prior to visit, ICD-9: 8)  total testosterone :  607.84 Prescriptions: CIALIS 20 MG  TABS (TADALAFIL) 1 by mouth once daily as needed  #5 x 11   Entered and Authorized by:   Corwin Levins MD   Signed by:   Corwin Levins MD on 07/02/2010   Method used:   Print then Give to Patient   RxID:   [id number] AMLODIPINE BESY-BENAZEPRIL HCL 5-20 MG CAPS (AMLODIPINE BESY-BENAZEPRIL HCL) 1po once daily  #90 x 3   Entered and Authorized by:   Corwin Levins MD   Signed by:   Corwin Levins MD on 07/02/2010   Method used:   Print then Give to Patient   RxID:   [id number] GLUCOPHAGE XR 500 MG  TB24 (METFORMIN HCL) one by mouth two times a day  #180 x 3   Entered and  Authorized by:   Corwin Levins MD   Signed by:   Corwin Levins MD on 07/02/2010  Method used:   Print then Give to Patient   RxID:   [id number] SIMVASTATIN 20 MG  TABS (SIMVASTATIN) Take 1 tablet by mouth once a day  #90 x 3   Entered and Authorized by:   Corwin Levins MD   Signed by:   Corwin Levins MD on 07/02/2010   Method used:   Print then Give to Patient   RxID:   [id number] PRILOSEC 20 MG  CPDR (OMEPRAZOLE) Take 1 tablet by mouth once a day  #90 x 3   Entered and Authorized by:   Corwin Levins MD   Signed by:   Corwin Levins MD on 07/02/2010   Method used:   Print then Give to Patient   RxID:   [id number] VALTREX 1 GM TABS (VALACYCLOVIR HCL) 1 by mouth two times a day  #180 x 3   Entered and Authorized by:   Corwin Levins MD   Signed by:   Corwin Levins MD on 07/02/2010   Method used:   Print then Give to Patient   RxID:   [id number] ANDROGEL 50 MG/5GM GEL (TESTOSTERONE) 1pk once daily - use asd  #90 x 3   Entered and Authorized by:   Corwin Levins MD   Signed by:   Corwin Levins MD on 07/02/2010   Method used:   Print then Give to Patient   RxID:   [id number]

## 2010-12-16 NOTE — Assessment & Plan Note (Signed)
Summary: 6 mos /$50/cd   Vital Signs:  Patient profile:   60 year old male Height:      71 inches Weight:      261.38 pounds BMI:     36.59 O2 Sat:      97 % on Room air Temp:     97.3 degrees F oral Pulse rate:   92 / minute BP sitting:   122 / 68  (left arm) Cuff size:   large  Vitals Entered ByZella Ball Ewing (January 02, 2010 8:21 AM)  O2 Flow:  Room air  CC: 6 Mo ROV/RE   Primary Care Provider:  Corwin Levins MD  CC:  6 Mo ROV/RE.  History of Present Illness: unfort gained 8 lbs iwth less excercise, more food and admits to higher chol diet;  overall good complaince iwth meds;  and tolerating well.  Had headache after start BP lsat vist but kept on taking the med and no further problem with headache or other side effeect such as cough, rash or swelling.  Pt denies CP, sob, doe, wheezing, orthopnea, pnd, worsening LE edema, palps, dizziness or syncope  Pt denies new neuro symptoms such as headache, facial or extremity weakness   Has very few cough nonprod that seems random  and same cough as before the ace use.  Pt denies polydipsia, polyuria, or low sugar symptoms such as shakiness improved with eating.  Overall good compliance with meds, little excercise however .  Only rare misses the metformin when traveling.    Problems Prior to Update: 1)  Cough  (ICD-786.2) 2)  Obstructive Sleep Apnea  (ICD-327.23) 3)  Hypersomnia  (ICD-780.54) 4)  Sexually Transmitted Disease, Exposure To  (ICD-V01.6) 5)  Gerd  (ICD-530.81) 6)  Diabetes Mellitus, Type II  (ICD-250.00) 7)  Psa, Increased  (ICD-790.93) 8)  Preventive Health Care  (ICD-V70.0) 9)  Boils, Recurrent  (ICD-680.9) 10)  Erectile Dysfunction  (ICD-302.72) 11)  Hyperlipidemia  (ICD-272.4) 12)  Hypertension  (ICD-401.9) 13)  Obesity, Unspecified  (ICD-278.00) 14)  Colonic Polyps, Hx of  (ICD-V12.72) 15)  Gastroesophageal Reflux Disease  (ICD-530.81) 16)  Other Abnormal Glucose  (ICD-790.29)  Medications Prior to  Update: 1)  Prilosec 20 Mg  Cpdr (Omeprazole) .... Take 1 Tablet By Mouth Once A Day 2)  Simvastatin 20 Mg  Tabs (Simvastatin) .... Take 1 Tablet By Mouth Once A Day 3)  Glucophage Xr 500 Mg  Tb24 (Metformin Hcl) .... One By Mouth Two Times A Day 4)  Amlodipine Besy-Benazepril Hcl 5-20 Mg Caps (Amlodipine Besy-Benazepril Hcl) .Marland Kitchen.. 1po Once Daily 5)  Cialis 20 Mg  Tabs (Tadalafil) .Marland Kitchen.. 1 By Mouth Once Daily As Needed 6)  Bactroban 2 %  Oint (Mupirocin) .... Use As Directed 7)  Adult Aspirin Ec Low Strength 81 Mg  Tbec (Aspirin) .Marland Kitchen.. 1 By Mouth Once Daily 8)  Valtrex 1 Gm Tabs (Valacyclovir Hcl) .Marland Kitchen.. 1 By Mouth Two Times A Day  Current Medications (verified): 1)  Prilosec 20 Mg  Cpdr (Omeprazole) .... Take 1 Tablet By Mouth Once A Day 2)  Simvastatin 20 Mg  Tabs (Simvastatin) .... Take 1 Tablet By Mouth Once A Day 3)  Glucophage Xr 500 Mg  Tb24 (Metformin Hcl) .... One By Mouth Two Times A Day 4)  Amlodipine Besy-Benazepril Hcl 5-20 Mg Caps (Amlodipine Besy-Benazepril Hcl) .Marland Kitchen.. 1po Once Daily 5)  Cialis 20 Mg  Tabs (Tadalafil) .Marland Kitchen.. 1 By Mouth Once Daily As Needed 6)  Bactroban 2 %  Oint (Mupirocin) .Marland KitchenMarland KitchenMarland Kitchen  Use As Directed 7)  Adult Aspirin Ec Low Strength 81 Mg  Tbec (Aspirin) .Marland Kitchen.. 1 By Mouth Once Daily 8)  Valtrex 1 Gm Tabs (Valacyclovir Hcl) .Marland Kitchen.. 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 now single. Former Smoker - started at age 58.  less than 1 ppd.  quit 1993. Alcohol use-yes (seldom) pt lives with his daughter.    Risk  Factors: Smoking Status: quit (11/23/2007)  Review of Systems       all otherwise negative per pt -   Physical Exam  General:  alert and overweight-appearing.   Head:  normocephalic and atraumatic.   Eyes:  vision grossly intact, pupils equal, and pupils round.   Ears:  R ear normal and L ear normal.   Nose:  no external deformity and no nasal discharge.   Mouth:  no gingival abnormalities and pharynx pink and moist.   Neck:  supple and no masses.   Lungs:  normal respiratory effort and normal breath sounds.   Heart:  normal rate and regular rhythm.   Extremities:  trace edema bilat, no ulcers or erythema   Impression & Recommendations:  Problem # 1:  DIABETES MELLITUS, TYPE II (ICD-250.00)  His updated medication list for this problem includes:    Glucophage Xr 500 Mg Tb24 (Metformin hcl) ..... One by mouth two times a day    Amlodipine Besy-benazepril Hcl 5-20 Mg Caps (Amlodipine besy-benazepril hcl) .Marland Kitchen... 1po once daily    Adult Aspirin Ec Low Strength 81 Mg Tbec (Aspirin) .Marland Kitchen... 1 by mouth once daily  Labs Reviewed: Creat: 1.0 (01/01/2010)    Reviewed HgBA1c results: 6.2 (01/01/2010)  5.9 (06/27/2009) stable overall by hx and exam, ok to continue meds/tx as is   Problem # 2:  HYPERTENSION (ICD-401.9)  His updated medication list for this problem includes:    Amlodipine Besy-benazepril Hcl 5-20 Mg Caps (Amlodipine besy-benazepril hcl) .Marland Kitchen... 1po once daily  BP today: 122/68 Prior BP: 118/80 (09/02/2009)  Labs Reviewed: K+: 4.8 (01/01/2010) Creat: : 1.0 (01/01/2010)   Chol: 191 (01/01/2010)   HDL: 63.20 (01/01/2010)   LDL: 107 (01/01/2010)   TG: 102.0 (01/01/2010) stable overall by hx and exam, ok to continue meds/tx as is   Problem # 3:  HYPERLIPIDEMIA (ICD-272.4)  His updated medication list for this problem includes:    Simvastatin 20 Mg Tabs (Simvastatin) .Marland Kitchen... Take 1 tablet by mouth once a day  Labs Reviewed: SGOT: 32 (06/27/2009)   SGPT: 32 (06/27/2009)    HDL:63.20 (01/01/2010), 50.40 (06/27/2009)  LDL:107 (01/01/2010), 88 (05/39/7673)  Chol:191 (01/01/2010), 150 (06/27/2009)  Trig:102.0 (01/01/2010), 59.0 (06/27/2009) stable overall by hx and exam, ok to continue meds/tx as is   Problem # 4:  COUGH (ICD-786.2) doubt ace related - ? allergic - to try clairitin otc as needed   Complete Medication List: 1)  Prilosec 20 Mg Cpdr (Omeprazole) .... Take 1 tablet by mouth once a day 2)  Simvastatin 20 Mg Tabs (Simvastatin) .... Take 1 tablet by mouth once a day 3)  Glucophage Xr 500 Mg Tb24 (Metformin hcl) .... One by mouth two times a day 4)  Amlodipine Besy-benazepril Hcl 5-20 Mg Caps (Amlodipine besy-benazepril hcl) .Marland Kitchen.. 1po once daily 5)  Cialis 20 Mg Tabs (Tadalafil) .Marland Kitchen.. 1 by mouth once daily as needed 6)  Bactroban 2 % Oint (Mupirocin) .... Use as directed 7)  Adult Aspirin Ec Low Strength 81 Mg Tbec (Aspirin) .Marland Kitchen.. 1 by mouth once daily 8)  Valtrex 1 Gm Tabs (Valacyclovir hcl) .Marland Kitchen.. 1 by mouth two times a day  Patient Instructions: 1)  Continue all previous medications as before this visit  2)  you can also try Claritin OTC for possible allergies causing the cough 3)  Please follow lower cholesterol diet, diabetic diet, and excercise daily 4)  Please schedule a follow-up appointment in 6 months with CPX labs and: 5)  HbgA1C prior to visit, ICD-9: 250.02 6)  Urine Microalbumin prior to visit, ICD-9:   Immunization History:  Influenza Immunization History:    Influenza:  historical (07/17/2009)

## 2010-12-16 NOTE — Assessment & Plan Note (Signed)
Summary: ?hemorroids/cd   Vital Signs:  Patient profile:   60 year old male Height:      70 inches Weight:      258.13 pounds BMI:     37.17 O2 Sat:      95 % on Room air Temp:     98.6 degrees F oral Pulse rate:   97 / minute BP sitting:   122 / 82  (left arm) Cuff size:   large  Vitals Entered By: Zella Ball Ewing CMA Duncan Dull) (September 11, 2010 3:13 PM)  O2 Flow:  Room air  CC: Hemorrhoids/RE   Primary Care Levette Paulick:  Corwin Levins MD  CC:  Hemorrhoids/RE.  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  Pt denies CP, worsening sob, doe, wheezing, orthopnea, pnd, worsening LE edema, palps, dizziness or syncope.  No current fever, wt loss, night sweats, loss of appetite or other constitutional symptoms. Last colonoscopy 2007   Also mentions still with ongoing lower back pain as per last OV, occurs worse  with standing for about 15 min, even sooner and increased  with walking.  Can ride bike 3-4 times per wk that does not hurt the back . No other bowel or bladder changes.  Does have some radaitaon to right thigh, but no other pain, numbness, or weakness to the LE's.  No falls or injury, fever, wt loss.   Pt denies polydipsia, polyuria, or low sugar symptoms such as shakiness improved with eating.  Overall good compliance with meds, trying to follow low chol, DM diet, wt stable, little excercise however   Problems Prior to Update: 1)  Hemorrhoid, External, Thrombosed  (ICD-455.4) 2)  Hypogonadism  (ICD-257.2) 3)  Back Pain  (ICD-724.5) 4)  Cough  (ICD-786.2) 5)  Obstructive Sleep Apnea  (ICD-327.23) 6)  Hypersomnia  (ICD-780.54) 7)   Sexually Transmitted Disease, Exposure To  (ICD-V01.6) 8)  Gerd  (ICD-530.81) 9)  Diabetes Mellitus, Type II  (ICD-250.00) 10)  Psa, Increased  (ICD-790.93) 11)  Preventive Health Care  (ICD-V70.0) 12)  Boils, Recurrent  (ICD-680.9) 13)  Erectile Dysfunction  (ICD-302.72) 14)  Hyperlipidemia  (ICD-272.4) 15)  Hypertension  (ICD-401.9) 16)  Obesity, Unspecified  (ICD-278.00) 17)  Colonic Polyps, Hx of  (ICD-V12.72) 18)  Gastroesophageal Reflux Disease  (ICD-530.81) 19)  Other Abnormal Glucose  (ICD-790.29)  Medications Prior to Update: 1)  Prilosec 20 Mg  Cpdr (Omeprazole) .... Take 1 Tablet By Mouth Once A Day 2)  Simvastatin 20 Mg  Tabs (Simvastatin) .... Take 1 Tablet By Mouth Once A Day 3)  Glucophage Xr 500 Mg  Tb24 (Metformin Hcl) .... One By Mouth Two Times A Day 4)  Amlodipine Besy-Benazepril Hcl 5-20 Mg Caps (Amlodipine Besy-Benazepril Hcl) .Marland Kitchen.. 1po Once Daily 5)  Cialis 20 Mg  Tabs (Tadalafil) .Marland Kitchen.. 1 By Mouth Once Daily As Needed 6)  Bactroban 2 %  Oint (Mupirocin) .... Use As Directed 7)  Adult Aspirin Ec Low Strength 81 Mg  Tbec (Aspirin) .Marland Kitchen.. 1 By Mouth Once Daily 8)  Valtrex 1 Gm Tabs (Valacyclovir Hcl) .Marland Kitchen.. 1 By Mouth Two Times A Day 9)  Hydrocodone-Acetaminophen 7.5-325 Mg Tabs (  Hydrocodone-Acetaminophen) .Marland Kitchen.. 1 By Mouth Q 6 Hrs As Needed Pain 10)  Androgel 50 Mg/5gm Gel (Testosterone) .Marland Kitchen.. 1pk Once Daily - Use Asd  Current Medications (verified): 1)  Prilosec 20 Mg  Cpdr (Omeprazole) .... Take 1 Tablet By Mouth Once A Day 2)  Simvastatin 20 Mg  Tabs (Simvastatin) .... Take 1 Tablet By Mouth Once A Day 3)  Glucophage Xr 500 Mg  Tb24 (Metformin Hcl) .... One By Mouth Two Times A Day 4)  Amlodipine Besy-Benazepril Hcl 5-20 Mg Caps (Amlodipine Besy-Benazepril Hcl) .Marland Kitchen.. 1po Once Daily 5)  Cialis 20 Mg  Tabs (Tadalafil) .Marland Kitchen.. 1 By Mouth Once Daily As Needed 6)  Bactroban 2 %  Oint (Mupirocin) .... Use As Directed 7)  Adult Aspirin Ec Low Strength 81 Mg  Tbec (Aspirin) .Marland Kitchen.. 1  By Mouth Once Daily 8)  Valtrex 1 Gm Tabs (Valacyclovir Hcl) .Marland Kitchen.. 1 By Mouth Two Times A Day 9)  Androgel 50 Mg/5gm Gel (Testosterone) .Marland Kitchen.. 1pk Once Daily - Use Asd 10)  Hydrocortisone Ace-Pramoxine 2.5-1 % Crea (Hydrocortisone Ace-Pramoxine) .... 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 Former Smoker - started at age 77.  less than 1 ppd.  quit 1993. Alcohol use-yes (seldom) pt lives with his daughter.    Risk Factors: Smoking Status: quit (11/23/2007)  Review of Systems       all otherwise negative per pt -    Physical Exam  General:  alert and overweight-appearing.   Head:  normocephalic and atraumatic.   Eyes:  vision grossly intact, pupils equal, and pupils round.   Ears:  R ear normal and L ear normal.   Nose:  no external deformity and no nasal discharge.   Mouth:  pharyngeal erythema and fair dentition.   Neck:  supple and no masses.   Lungs:  normal respiratory effort and normal breath sounds.   Heart:  normal rate and regular rhythm.   Abdomen:  soft, non-tender, and normal bowel sounds.   Rectal:  moderate, mild tender and swollen thrombosed ext hemorrhoid noted Msk:  mild low mid lumbar and left paravertebral tender Extremities:  no edema, no erythema  Neurologic:  strength normal in all extremities, sensation intact to light touch, gait normal, and DTRs symmetrical and normal.     Impression & Recommendations:  Problem # 1:  HEMORRHOID, EXTERNAL, THROMBOSED (ICD-455.4) d/w pt , mild   and improving, likely the sourse of the BRB, for proctofoam HC as needed, ok to hold on referral for f/u colonoscopy at this itme  Problem # 2:  BACK PAIN (ICD-724.5)  The following medications were removed from the medication list:    Hydrocodone-acetaminophen 7.5-325 Mg Tabs (Hydrocodone-acetaminophen) .Marland Kitchen... 1 by mouth q 6 hrs as needed pain His updated medication list for this problem includes:    Adult Aspirin Ec Low Strength 81 Mg Tbec (Aspirin) .Marland Kitchen... 1 by mouth once daily persistent, with right sided radiaton, exam bening and pain mild to mod but overall new, persistent since july and should image to r/o spinal stenosis or other  Orders: Radiology Referral (Radiology)  Problem # 3:  DIABETES MELLITUS, TYPE II (ICD-250.00)  His updated medication list for this problem includes:    Glucophage Xr 500 Mg Tb24 (Metformin hcl) ..... One by mouth two times a day    Amlodipine Besy-benazepril Hcl 5-20 Mg Caps (Amlodipine besy-benazepril hcl) .Marland Kitchen... 1po once daily    Adult Aspirin Ec Low Strength 81 Mg Tbec (Aspirin) .Marland Kitchen... 1 by mouth once daily  Labs Reviewed: Creat: 1.0 (06/27/2010)    Reviewed HgBA1c results: 6.2 (01/01/2010)  5.9 (06/27/2009) stable overall by hx and exam, ok to continue meds/tx as is , Pt to cont DM diet, excercise, wt control efforts; to check labs with next visit  Problem # 4:  HYPERTENSION (ICD-401.9)  His updated medication list for this problem includes:    Amlodipine Besy-benazepril Hcl 5-20 Mg Caps (Amlodipine besy-benazepril hcl) .Marland Kitchen... 1po once daily  BP today: 122/82 Prior BP: 124/70 (07/02/2010)  Labs Reviewed: K+: 5.2 (06/27/2010) Creat: : 1.0 (06/27/2010)   Chol: 158 (06/27/2010)   HDL: 50.20 (06/27/2010)   LDL: 85 (06/27/2010)   TG: 113.0 (06/27/2010) stable overall by hx and exam, ok to continue meds/tx as is   Complete Medication List: 1)  Prilosec 20 Mg Cpdr (Omeprazole) .... Take 1 tablet by mouth once a day 2)  Simvastatin 20 Mg Tabs  (Simvastatin) .... Take 1 tablet by mouth once a day 3)  Glucophage Xr 500 Mg Tb24 (Metformin hcl) .... One by mouth two times a day 4)  Amlodipine Besy-benazepril Hcl 5-20 Mg Caps (Amlodipine besy-benazepril hcl) .Marland Kitchen.. 1po once daily 5)  Cialis 20 Mg Tabs (Tadalafil) .Marland Kitchen.. 1 by mouth once daily as needed 6)  Bactroban 2 % Oint (Mupirocin) .... Use as directed 7)  Adult Aspirin Ec Low Strength 81 Mg Tbec (Aspirin) .Marland Kitchen.. 1 by mouth once daily 8)  Valtrex 1 Gm Tabs (Valacyclovir hcl) .Marland Kitchen.. 1 by mouth two times a day 9)  Androgel 50 Mg/5gm Gel (Testosterone) .Marland Kitchen.. 1pk once daily - use asd 10)  Hydrocortisone Ace-pramoxine 2.5-1 % Crea (Hydrocortisone ace-pramoxine) .... Use asd two times a day as needed  Patient Instructions: 1)  Please take all new medications as prescribed 2)  Continue all previous medications as before this visit  3)  You will be contacted about the referral(s) to: MRI of the lower back 4)  Please schedule a follow-up appointment in 4 months with: 5)  BMP prior to visit, ICD-9: 250.02 6)  Lipid Panel prior to visit, ICD-9: 7)  HbgA1C prior to visit, ICD-9: Prescriptions: HYDROCORTISONE ACE-PRAMOXINE 2.5-1 % CREA (HYDROCORTISONE ACE-PRAMOXINE) use asd two times a day as needed  #1 x 1   Entered and Authorized by:   Corwin Levins MD   Signed by:   Corwin Levins MD on 09/11/2010   Method used:   Print then Give to Patient   RxID:   [id number]    Orders Added: 1)  Radiology Referral [Radiology] 2)  Est. Patient Level IV [14782]

## 2010-12-18 NOTE — Progress Notes (Signed)
Summary: Records Request  Faxed OV & Stress to Beth (per Delice Bison) at [facility] ([phone]).  Debby Freiberg  November 26, 2010 12:04 PM

## 2010-12-18 NOTE — Letter (Signed)
Summary: [facility]  [facility]   Imported By: Lester [place] 10/31/2010 09:51:11  _____________________________________________________________________  External Attachment:    Type:   Image     Comment:   External Document

## 2010-12-29 ENCOUNTER — Other Ambulatory Visit: Payer: Self-pay

## 2010-12-30 ENCOUNTER — Other Ambulatory Visit: Payer: Self-pay | Admitting: Internal Medicine

## 2010-12-30 ENCOUNTER — Other Ambulatory Visit: Payer: BC Managed Care – PPO

## 2010-12-30 ENCOUNTER — Encounter (INDEPENDENT_AMBULATORY_CARE_PROVIDER_SITE_OTHER): Payer: Self-pay | Admitting: *Deleted

## 2010-12-30 DIAGNOSIS — E1165 Type 2 diabetes mellitus with hyperglycemia: Secondary | ICD-10-CM

## 2010-12-30 DIAGNOSIS — N529 Male erectile dysfunction, unspecified: Secondary | ICD-10-CM

## 2010-12-30 LAB — LIPID PANEL
Cholesterol: 165 mg/dL (ref 0–200)
HDL: 51.9 mg/dL (ref >39.00)
LDL Cholesterol: 87 mg/dL (ref 0–99)
Total CHOL/HDL Ratio: 3
Triglycerides: 131 mg/dL (ref 0.0–149.0)
VLDL: 26.2 mg/dL (ref 0.0–40.0)

## 2010-12-30 LAB — BASIC METABOLIC PANEL
BUN: 17 mg/dL (ref 6–23)
CO2: 31 mEq/L (ref 19–32)
Calcium: 9.8 mg/dL (ref 8.4–10.5)
Chloride: 105 mEq/L (ref 96–112)
Creatinine, Ser: 0.9 mg/dL (ref 0.4–1.5)
GFR: 98.6 mL/min (ref >60.00)
Glucose, Bld: 103 mg/dL — ABNORMAL HIGH (ref 70–99)
Potassium: 4.7 mEq/L (ref 3.5–5.1)
Sodium: 141 mEq/L (ref 135–145)

## 2010-12-30 LAB — TESTOSTERONE: Testosterone: 259.51 ng/dL — ABNORMAL LOW (ref 350.00–890.00)

## 2010-12-30 LAB — HEMOGLOBIN A1C: Hgb A1c MFr Bld: 6.2 % (ref 4.6–6.5)

## 2011-01-05 ENCOUNTER — Encounter: Payer: Self-pay | Admitting: Internal Medicine

## 2011-01-05 ENCOUNTER — Ambulatory Visit (INDEPENDENT_AMBULATORY_CARE_PROVIDER_SITE_OTHER): Payer: BC Managed Care – PPO | Admitting: Internal Medicine

## 2011-01-05 DIAGNOSIS — E785 Hyperlipidemia, unspecified: Secondary | ICD-10-CM

## 2011-01-05 DIAGNOSIS — I10 Essential (primary) hypertension: Secondary | ICD-10-CM

## 2011-01-05 DIAGNOSIS — E119 Type 2 diabetes mellitus without complications: Secondary | ICD-10-CM
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
## 2011-04-03 NOTE — Assessment & Plan Note (Signed)
[facility]                           PRIMARY CARE OFFICE NOTE   NAME:[name], [name]                          MRN:          [id number]  DATE:02/01/2007                            DOB:          [birth year]    CHIEF COMPLAINT:  New patient to practice.   HISTORY OF PRESENT ILLNESS:  The patient is a 60 year old Hispanic male,  here to establish primary care.  He has moved to [place] from [place]  approximately one year ago.  He denied any history of major illnesses or  hospitalizations; however, he was evaluated at [facility] Emergency Room in February 2008, secondary to recurring chest  discomfort.  The patient states that his symptoms started approximately  one month ago.  He noticed progressive worsening dyspnea when he climbs  up the stairs to his condo, which is located on the third floor.  The  patient also has had dyspnea and mild chest discomfort with exercise  using an elliptical machine at a local gym.  During the day of the  emergency room evaluation, the patient was riding in his car and  experienced pain in his upper left arm which prompted an emergency room  evaluation.  An electrocardiogram was performed at that time, which was  reported unremarkable by the patient.  Blood work was in the process,  but due to a long delay, the patient's labs and chest x-ray were  cancelled.   The patient states that his dyspnea is persistent.  His chest pain has  improved, but not completely resolved.  His chest pain usually does not  occur with exertion.  He denies any associated diaphoresis.  Some  radiation into the left arm.  He also has a history of frequent  heartburn.  He has been treating himself with over-the-counter Prilosec.  He takes the medication twice per week, but has frequent exacerbations.   He does not recall issues with blood pressure.  He is unsure of his  cholesterol status.   PAST MEDICAL HISTORY:  1.  Gastroesophageal reflux disease.  2. History of colon polyps.  3. Obesity.   CURRENT MEDICATIONS:  1. Prilosec 20 mg OTC twice per week.  2. Aspirin p.r.n.   ALLERGIES:  No known drug allergies.   SOCIAL HISTORY:  The patient is divorced, currently living alone.  He is  originally from [place].  He is employed at the [employer] as a Merchandiser, retail.   FAMILY HISTORY:  Parents are living.  Father has hypertension and  elevated cholesterol.  He denies a family history of coronary artery  disease.  No reported cancer in the family.  He seldom drinks.  He quit  tobacco in 1993.  He was a light smoker, 1/3 of a pack per day, for  approximately 19 years.   REVIEW OF SYSTEMS:  No fevers, no chills.  No HEENT symptoms.  CARDIOVASCULAR/RESPIRATORY:  The complaints as noted above.  GI:  As  noted above.  No dysuria.  No report of frequent nocturia, polyuria or  polydipsia.  He has had some issues with mild  erectile dysfunction, more  within the recent past.   PHYSICAL EXAMINATION:  VITAL SIGNS:  Weight 261 pounds, temperature 98.2  degrees, pulse 86, blood pressure 140/90 in the left arm in the seated  position.  GENERAL:  The patient is a pleasant, overweight 60 year old Hispanic  male.  HEENT:  Normocephalic and atraumatic.  Pupils equal and reactive to  light bilaterally.  Extraocular movements intact.  The patient was  anicteric.  Conjunctivae within normal limits.  Canals and tympanic  membranes are clear bilaterally.  Oropharyngeal exam revealed crowded  upper airway, otherwise unremarkable.  NECK:  Thick, but without any evidence of carotid bruit or thyromegaly.  CHEST:  Normal respiratory effort.  Chest is clear to auscultation  bilaterally.  No rhonchi, rales, no wheezes.  CARDIOVASCULAR:  A regular rate and rhythm.  No significant murmurs,  rubs or gallops appreciated.  ABDOMEN:  Protuberant, nontender.  Positive bowel sounds.  No  organomegaly.  MUSCULOSKELETAL:  No clubbing,  cyanosis or edema.  The patient has  intact pedis dorsalis pulses.  SKIN:  Warm and dry.  NEUROLOGIC:  Cranial nerves II-XII  grossly intact.  He was nonfocal.   IMPRESSION/RECOMMENDATIONS:  1. Chest pain.  2. Gastroesophageal reflux disease.  3. History of colon polyps.  4. Health maintenance.   PLAN:  1. The patient's symptomatology is concerning for coronary artery      disease.  He will be referred to [facility] urgently for      followup.  2. They will schedule a treadmill Myoview.  3. He was instructed to take a full dose of enteric-coated aspirin 325      mg once a day.  I will defer starting a beta blocker to Dr. Theron Arista      C. Nishan.  4. He was told to monitor his blood pressure readings as an outpatient      and obtain at least 10 readings before our next follow-up visit.  5. In terms of his reflux, I recommended taking Omeprazole 20 mg      b.i.d. a.c. on a regular      basis.  He was given a prescription.  There is certainly a      possibility that his atypical      features of his chest may be related to gastroesophageal reflux      disease.  6. I ordered fasting lipids and a hemoglobin A1c, to further risk      stratify.     Barbette Hair. Artist Pais, DO  Electronically Signed    RDY/MedQ  DD: 02/01/2007  DT: 02/02/2007  Job #: [id number]
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
## 2011-04-03 NOTE — Procedures (Signed)
[facility]                              EXERCISE TREADMILL   NAME:[name] [name], [name] [name]                          MRN:          [id number]  DATE:02/02/2007                            DOB:          1951/01/22    [name] is a 60 year old patient with chest pain.  Treadmill test was  done to rule out ischemia.   The patient exercised 10 minutes on the Bruce protocol.  Exercise was  stopped due to fatigue.  Maximum heart rate was 173, peak blood pressure  193/65.  In recovery, it actually increased to 216/60.   Resting EKG was normal with stress.  There was 1 mm of ST-segment  depression in the inferolateral leads, occasional PVCs.   IMPRESSION:  Abnormal stress test with 1 mm of ST-segment depression in  the inferolateral leads.  I suspect these changes are from his  hypertension.  He has already been scheduled for a Myoview this coming  Monday by Dr. Artist Pais.  He will keep this appointment since his treadmill  test was abnormal.  I strongly encouraged the patient to monitor his  blood pressure.  I suspect he should be on blood pressure medicine with  this type of hypertensive response.   He will follow up with Dr. Artist Pais for his blood pressure.  I think that  lisinopril 10 mg  a day would be a reasonable medicine to start if he is  willing to take medicines.     Noralyn Pick. Eden Emms, MD, [facility]  Electronically Signed    PCN/MedQ  DD: 02/02/2007  DT: 02/02/2007  Job #: [id number]

## 2011-05-20 ENCOUNTER — Other Ambulatory Visit: Payer: Self-pay | Admitting: Internal Medicine

## 2011-06-29 ENCOUNTER — Other Ambulatory Visit: Payer: Self-pay | Admitting: Internal Medicine

## 2011-06-29 ENCOUNTER — Other Ambulatory Visit: Payer: BC Managed Care – PPO

## 2011-06-29 DIAGNOSIS — Z0000 Encounter for general adult medical examination without abnormal findings: Secondary | ICD-10-CM

## 2011-06-29 DIAGNOSIS — Z1289 Encounter for screening for malignant neoplasm of other sites: Secondary | ICD-10-CM

## 2011-06-30 DIAGNOSIS — R972 Elevated prostate specific antigen [PSA]: Secondary | ICD-10-CM

## 2011-06-30 DIAGNOSIS — F528 Other sexual dysfunction not due to a substance or known physiological condition: Secondary | ICD-10-CM

## 2011-07-06 ENCOUNTER — Encounter: Payer: BC Managed Care – PPO | Admitting: Internal Medicine

## 2011-07-30 ENCOUNTER — Other Ambulatory Visit: Payer: Self-pay | Admitting: Internal Medicine

## 2011-07-31 ENCOUNTER — Encounter: Payer: Self-pay | Admitting: Internal Medicine

## 2011-07-31 DIAGNOSIS — R7302 Impaired glucose tolerance (oral): Secondary | ICD-10-CM

## 2011-07-31 DIAGNOSIS — Z0000 Encounter for general adult medical examination without abnormal findings: Secondary | ICD-10-CM | POA: Insufficient documentation

## 2011-07-31 HISTORY — DX: Impaired glucose tolerance (oral): R73.02

## 2011-08-01 ENCOUNTER — Other Ambulatory Visit: Payer: Self-pay | Admitting: Internal Medicine

## 2011-08-03 ENCOUNTER — Telehealth: Payer: Self-pay

## 2011-08-03 ENCOUNTER — Other Ambulatory Visit (INDEPENDENT_AMBULATORY_CARE_PROVIDER_SITE_OTHER): Payer: BC Managed Care – PPO

## 2011-08-03 DIAGNOSIS — Z1289 Encounter for screening for malignant neoplasm of other sites: Secondary | ICD-10-CM

## 2011-08-03 DIAGNOSIS — IMO0001 Reserved for inherently not codable concepts without codable children: Secondary | ICD-10-CM

## 2011-08-03 DIAGNOSIS — F529 Unspecified sexual dysfunction not due to a substance or known physiological condition: Secondary | ICD-10-CM

## 2011-08-03 DIAGNOSIS — Z0000 Encounter for general adult medical examination without abnormal findings: Secondary | ICD-10-CM

## 2011-08-03 LAB — URINALYSIS
Bilirubin Urine: NEGATIVE
Hgb urine dipstick: NEGATIVE
Leukocytes, UA: NEGATIVE
Nitrite: NEGATIVE

## 2011-08-03 LAB — CBC WITH DIFFERENTIAL/PLATELET
Basophils Absolute: 0 10*3/uL (ref 0.0–0.1)
Hemoglobin: 14.5 g/dL (ref 13.0–17.0)
Lymphocytes Relative: 39.5 % (ref 12.0–46.0)
Monocytes Relative: 6.5 % (ref 3.0–12.0)
Neutro Abs: 3.4 10*3/uL (ref 1.4–7.7)
Neutrophils Relative %: 52.4 % (ref 43.0–77.0)
RBC: 4.97 Mil/uL (ref 4.22–5.81)
RDW: 13.8 % (ref 11.5–14.6)

## 2011-08-03 LAB — LIPID PANEL
HDL: 52.3 mg/dL (ref >39.00)
LDL Cholesterol: 90 mg/dL (ref 0–99)
Total CHOL/HDL Ratio: 3
VLDL: 14 mg/dL (ref 0.0–40.0)

## 2011-08-03 LAB — MICROALBUMIN / CREATININE URINE RATIO
Creatinine,U: 296.6 mg/dL
Microalb Creat Ratio: 0.9 mg/g (ref 0.0–30.0)
Microalb, Ur: 2.7 mg/dL — ABNORMAL HIGH (ref 0.0–1.9)

## 2011-08-03 LAB — BASIC METABOLIC PANEL
BUN: 20 mg/dL (ref 6–23)
CO2: 26 mEq/L (ref 19–32)
GFR: 67.39 mL/min (ref >60.00)
Glucose, Bld: 111 mg/dL — ABNORMAL HIGH (ref 70–99)
Potassium: 4.8 mEq/L (ref 3.5–5.1)
Sodium: 139 mEq/L (ref 135–145)

## 2011-08-03 LAB — HEPATIC FUNCTION PANEL
AST: 51 U/L — ABNORMAL HIGH (ref 0–37)
Albumin: 4.6 g/dL (ref 3.5–5.2)

## 2011-08-03 LAB — TSH: TSH: 1.17 u[IU]/mL (ref 0.35–5.50)

## 2011-08-03 NOTE — Telephone Encounter (Signed)
A user error has taken place: encounter opened in error, closed for administrative reasons.

## 2011-08-07 ENCOUNTER — Other Ambulatory Visit: Payer: Self-pay

## 2011-08-07 ENCOUNTER — Other Ambulatory Visit: Payer: Self-pay | Admitting: Internal Medicine

## 2011-08-07 ENCOUNTER — Encounter: Payer: BC Managed Care – PPO | Admitting: Internal Medicine

## 2011-08-07 MED ORDER — AMLODIPINE BESY-BENAZEPRIL HCL 5-20 MG PO CAPS: 1.0000 | ORAL_CAPSULE | Freq: Every day | ORAL | Status: DC

## 2011-08-13 ENCOUNTER — Encounter: Payer: BC Managed Care – PPO | Admitting: Internal Medicine

## 2011-08-13 ENCOUNTER — Other Ambulatory Visit: Payer: Self-pay | Admitting: Internal Medicine

## 2011-08-24 ENCOUNTER — Encounter: Payer: Self-pay | Admitting: Internal Medicine

## 2011-08-24 ENCOUNTER — Ambulatory Visit (INDEPENDENT_AMBULATORY_CARE_PROVIDER_SITE_OTHER): Payer: BC Managed Care – PPO | Admitting: Internal Medicine

## 2011-08-24 VITALS — BP 124/70 | HR 86 | Temp 98.3°F | Ht 70.0 in | Wt 245.0 lb

## 2011-08-24 DIAGNOSIS — Z23 Encounter for immunization: Secondary | ICD-10-CM

## 2011-08-24 DIAGNOSIS — R972 Elevated prostate specific antigen [PSA]: Secondary | ICD-10-CM

## 2011-08-24 DIAGNOSIS — M549 Dorsalgia, unspecified: Secondary | ICD-10-CM

## 2011-08-24 DIAGNOSIS — Z0000 Encounter for general adult medical examination without abnormal findings: Secondary | ICD-10-CM

## 2011-08-24 DIAGNOSIS — E291 Testicular hypofunction: Secondary | ICD-10-CM

## 2011-08-24 DIAGNOSIS — I10 Essential (primary) hypertension: Secondary | ICD-10-CM

## 2011-08-24 DIAGNOSIS — E119 Type 2 diabetes mellitus without complications: Secondary | ICD-10-CM

## 2011-08-24 MED ORDER — BENAZEPRIL HCL 20 MG PO TABS: 20.0000 mg | ORAL_TABLET | Freq: Every day | ORAL | Status: DC

## 2011-08-24 MED ORDER — HYDROCORTISONE ACE-PRAMOXINE 2.5-1 % RE CREA: TOPICAL_CREAM | Freq: Two times a day (BID) | RECTAL | Status: DC

## 2011-08-24 MED ORDER — VALACYCLOVIR HCL 1 G PO TABS: 1000.0000 mg | ORAL_TABLET | Freq: Two times a day (BID) | ORAL | Status: DC

## 2011-08-24 MED ORDER — AMLODIPINE BESYLATE 5 MG PO TABS: 5.0000 mg | ORAL_TABLET | Freq: Every day | ORAL | Status: DC

## 2011-08-24 MED ORDER — METFORMIN HCL ER 500 MG PO TB24: 500.0000 mg | ORAL_TABLET | Freq: Every day | ORAL | Status: DC

## 2011-08-24 MED ORDER — MUPIROCIN 2 % EX OINT: TOPICAL_OINTMENT | Freq: Every day | CUTANEOUS | Status: DC

## 2011-08-24 MED ORDER — TADALAFIL 20 MG PO TABS: 20.0000 mg | ORAL_TABLET | Freq: Every day | ORAL | Status: DC | PRN

## 2011-08-24 MED ORDER — TESTOSTERONE 50 MG/5GM (1%) TD GEL: 5.0000 g | Freq: Every day | TRANSDERMAL | Status: DC

## 2011-08-24 MED ORDER — OMEPRAZOLE 20 MG PO CPDR: 20.0000 mg | DELAYED_RELEASE_CAPSULE | Freq: Every day | ORAL | Status: DC

## 2011-08-24 MED ORDER — NABUMETONE 750 MG PO TABS: 750.0000 mg | ORAL_TABLET | Freq: Two times a day (BID) | ORAL | Status: DC

## 2011-08-24 MED ORDER — SIMVASTATIN 20 MG PO TABS: 20.0000 mg | ORAL_TABLET | Freq: Every day | ORAL | Status: DC
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
## 2011-08-24 NOTE — Assessment & Plan Note (Signed)
stable overall by hx and exam, most recent data reviewed with pt, and pt to continue medical treatment as before, except to change the lotrel to the component meds to lessen cost
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
## 2011-08-24 NOTE — Assessment & Plan Note (Signed)
stable overall by hx and exam, most recent data reviewed with pt, and pt to continue medical treatment as before  Lab Results  Component Value Date   HGBA1C 6.1 08/03/2011

## 2011-08-24 NOTE — Assessment & Plan Note (Signed)

## 2011-08-24 NOTE — Assessment & Plan Note (Signed)
stable overall by hx and exam, most recent data reviewed with pt, and pt to continue medical treatment as before ble  For re-check testosteron next visit

## 2011-08-24 NOTE — Patient Instructions (Addendum)
You had the flu shot today Please call your insurance, and if the shingles shot is covered, please return for this at a Nurse Visit Continue all other medications as before, except the lotrel (for blood pressure) is changed to the 2 individual blood pressure generic medications to be less expensive All of your medications were sent to the pharmacy, except for the ones given in hardcopy Please return in 6 mo with Lab testing done 3-5 days before (testosterone, sugar, and PSA repeat)

## 2011-08-24 NOTE — Assessment & Plan Note (Addendum)
Has been as high as 7 in 2009 , saw urology and neg prostate bx,  Also on androgel,  PSA increased from 3 yrs age, pt declines further urology eval at this time, will check PSA in 6 mo

## 2011-09-02 ENCOUNTER — Telehealth: Payer: Self-pay

## 2011-09-02 NOTE — Telephone Encounter (Signed)
PA Approval received for omeprazole from 08/12/2011 - 09/01/2012

## 2011-10-07 ENCOUNTER — Other Ambulatory Visit: Payer: Self-pay | Admitting: Internal Medicine

## 2012-01-04 ENCOUNTER — Other Ambulatory Visit: Payer: Self-pay | Admitting: Internal Medicine

## 2012-02-22 ENCOUNTER — Ambulatory Visit (INDEPENDENT_AMBULATORY_CARE_PROVIDER_SITE_OTHER): Payer: BC Managed Care – PPO | Admitting: Internal Medicine

## 2012-02-22 ENCOUNTER — Other Ambulatory Visit (INDEPENDENT_AMBULATORY_CARE_PROVIDER_SITE_OTHER): Payer: BC Managed Care – PPO

## 2012-02-22 ENCOUNTER — Encounter: Payer: Self-pay | Admitting: Internal Medicine

## 2012-02-22 VITALS — BP 110/68 | HR 75 | Temp 97.8°F | Ht 70.0 in | Wt 245.0 lb

## 2012-02-22 DIAGNOSIS — F528 Other sexual dysfunction not due to a substance or known physiological condition: Secondary | ICD-10-CM

## 2012-02-22 DIAGNOSIS — I10 Essential (primary) hypertension: Secondary | ICD-10-CM

## 2012-02-22 DIAGNOSIS — E119 Type 2 diabetes mellitus without complications: Secondary | ICD-10-CM

## 2012-02-22 DIAGNOSIS — Z0000 Encounter for general adult medical examination without abnormal findings: Secondary | ICD-10-CM

## 2012-02-22 DIAGNOSIS — E291 Testicular hypofunction: Secondary | ICD-10-CM

## 2012-02-22 DIAGNOSIS — E785 Hyperlipidemia, unspecified: Secondary | ICD-10-CM

## 2012-02-22 DIAGNOSIS — R972 Elevated prostate specific antigen [PSA]: Secondary | ICD-10-CM

## 2012-02-22 LAB — LIPID PANEL
Cholesterol: 176 mg/dL (ref 0–200)
HDL: 55.8 mg/dL (ref >39.00)
LDL Cholesterol: 97 mg/dL (ref 0–99)
VLDL: 23.2 mg/dL (ref 0.0–40.0)

## 2012-02-22 LAB — BASIC METABOLIC PANEL
BUN: 17 mg/dL (ref 6–23)
Calcium: 9.8 mg/dL (ref 8.4–10.5)
GFR: 79.57 mL/min (ref >60.00)
Potassium: 5.1 mEq/L (ref 3.5–5.1)

## 2012-02-22 MED ORDER — VARDENAFIL HCL 20 MG PO TABS: 20.0000 mg | ORAL_TABLET | ORAL | Status: DC | PRN

## 2012-02-22 NOTE — Patient Instructions (Signed)
Take all new medications as prescribed Continue all other medications as before Please go to LAB in the Basement for the blood and/or urine tests to be done today You will be contacted by phone if any changes need to be made immediately.  Otherwise, you will receive a letter about your results with an explanation. Please return in 6 mo with Lab testing done 3-5 days before  

## 2012-02-22 NOTE — Assessment & Plan Note (Signed)
stable overall by hx and exam, most recent data reviewed with pt, and pt to continue medical treatment as before  Lab Results  Component Value Date   LDLCALC 90 08/03/2011

## 2012-02-22 NOTE — Progress Notes (Signed)
Subjective:    Patient ID: [name] [name], male    DOB: [birth year], 61 y.o.   MRN: [id number]  HPI  Here to f/u; overall doing ok,  Pt denies chest pain, increased sob or doe, wheezing, orthopnea, PND, increased LE swelling, palpitations, dizziness or syncope.  Pt denies new neurological symptoms such as new headache, or facial or extremity weakness or numbness   Pt denies polydipsia, polyuria, or low sugar symptoms such as weakness or confusion improved with po intake.  Pt states overall good compliance with meds, trying to follow lower cholesterol, diabetic diet, wt overall stable but little exercise however.  Denies worsening prostatism.  Denies urinary symptoms such as dysuria, frequency, urgency,or hematuria.  Has ongoing Ed symptoms, due to testosterone f/u, cialis too expensive.   Pt denies fever, wt loss, night sweats, loss of appetite, or other constitutional symptoms.  Overall good compliance with treatment, and good medicine tolerability. Past Medical History  Diagnosis Date  . BACK PAIN 05/28/2010  . BOILS, RECURRENT 11/23/2007  . COLONIC POLYPS, HX OF 11/23/2007  . Cough 01/02/2010  . DIABETES MELLITUS, TYPE II 06/01/2008  . Esophageal reflux 11/23/2007  . HEMORRHOID, EXTERNAL, THROMBOSED 09/11/2010  . HYPERLIPIDEMIA 11/23/2007  . HYPERSOMNIA 07/01/2009  . HYPERTENSION 11/23/2007  . HYPOGONADISM 07/02/2010  . LUMBAR RADICULOPATHY, RIGHT 09/17/2010  . Obesity, unspecified 11/23/2007  . OBSTRUCTIVE SLEEP APNEA 07/11/2009  . Other abnormal glucose 11/22/2007  . Impaired glucose tolerance 07/31/2011   No past surgical history on file.  reports that he has quit smoking. He does not have any smokeless tobacco history on file. He reports that he drinks alcohol. He reports that he does not use illicit drugs. family history includes Asthma in his sister; Heart disease in his father; Hyperlipidemia in his father; and Hypertension in his father. No Known Allergies Current Outpatient Prescriptions on File  Prior to Visit  Medication Sig Dispense Refill  . amLODipine-benazepril (LOTREL) 5-20 MG per capsule TAKE 1 CAPSULE BY MOUTH DAILY  30 capsule  8  . aspirin 81 MG tablet Take 81 mg by mouth daily.        . hydrocortisone-pramoxine (ANALPRAM-HC) 2.5-1 % rectal cream Place rectally 2 (two) times daily.  30 g  2  . metFORMIN (GLUCOPHAGE-XR) 500 MG 24 hr tablet Take 1 tablet (500 mg total) by mouth daily with breakfast.  90 tablet  3  . mupirocin (BACTROBAN) 2 % ointment Apply topically daily.  22 g  1  . nabumetone (RELAFEN) 750 MG tablet Take 1 tablet (750 mg total) by mouth 2 (two) times daily.  60 tablet  5  . omeprazole (PRILOSEC) 20 MG capsule Take 1 capsule (20 mg total) by mouth daily.  90 capsule  3  . simvastatin (ZOCOR) 20 MG tablet Take 1 tablet (20 mg total) by mouth at bedtime.  90 tablet  3  . tadalafil (CIALIS) 20 MG tablet Take 1 tablet (20 mg total) by mouth daily as needed for erectile dysfunction.  5 tablet  11  . testosterone (ANDROGEL) 50 MG/5GM GEL Place 5 g onto the skin daily.  450 g  5  . valACYclovir (VALTREX) 1000 MG tablet Take 1 tablet (1,000 mg total) by mouth 2 (two) times daily.  20 tablet  5  . vardenafil (LEVITRA) 20 MG tablet Take 1 tablet (20 mg total) by mouth as needed for erectile dysfunction.  5 tablet  11   Review of Systems Review of Systems  Constitutional: Negative for diaphoresis and unexpected weight  change.  HENT: Negative for drooling and tinnitus.   Eyes: Negative for photophobia and visual disturbance.  Respiratory: Negative for choking and stridor.   Gastrointestinal: Negative for vomiting and blood in stool.  Genitourinary: Negative for hematuria and decreased urine volume.  Objective:   Physical Exam BP 110/68  Pulse 75  Temp(Src) 97.8 F (36.6 C) (Oral)  Ht 5\' 10"  (1.778 m)  Wt 245 lb (111.131 kg)  BMI 35.15 kg/m2  SpO2 96% Physical Exam  VS noted Constitutional: Pt appears well-developed and well-nourished.  HENT: Head:  Normocephalic.  Right Ear: External ear normal.  Left Ear: External ear normal.  Eyes: Conjunctivae and EOM are normal. Pupils are equal, round, and reactive to light.  Neck: Normal range of motion. Neck supple.  Cardiovascular: Normal rate and regular rhythm.   Pulmonary/Chest: Effort normal and breath sounds normal.  Abd:  Soft, NT, non-distended, + BS Neurological: Pt is alert. No cranial nerve deficit.  Skin: Skin is warm. No erythema.  Psychiatric: Pt behavior is normal. Thought content normal.     Assessment & Plan:

## 2012-02-22 NOTE — Assessment & Plan Note (Signed)
Ok for levitra prn, for testosterone f/u

## 2012-02-22 NOTE — Assessment & Plan Note (Signed)
stable overall by hx and exam, most recent data reviewed with pt, and pt to continue medical treatment as before - cont diet control, focus on diet, more activity, wt loss Lab Results  Component Value Date   HGBA1C 6.1 08/03/2011

## 2012-02-22 NOTE — Assessment & Plan Note (Signed)
Was elev last visit, delcined urology f/u then, had seen Dr Peterson/urology in the past, for f/u psa today, asympt

## 2012-02-22 NOTE — Assessment & Plan Note (Signed)
BP Readings from Last 3 Encounters:  02/22/12 110/68  08/24/11 124/70  01/05/11 114/78   stable overall by hx and exam, most recent data reviewed with pt, and pt to continue medical treatment as before

## 2012-02-23 ENCOUNTER — Encounter: Payer: Self-pay | Admitting: Internal Medicine

## 2012-02-23 LAB — TESTOSTERONE, FREE, TOTAL, SHBG
Testosterone-% Free: 3 % — ABNORMAL HIGH (ref 1.6–2.9)
Testosterone: 418.32 ng/dL (ref 300–890)

## 2012-02-25 ENCOUNTER — Encounter: Payer: Self-pay | Admitting: Internal Medicine

## 2012-04-12 ENCOUNTER — Telehealth: Payer: Self-pay | Admitting: Internal Medicine

## 2012-04-12 ENCOUNTER — Encounter: Payer: Self-pay | Admitting: Endocrinology

## 2012-04-12 ENCOUNTER — Ambulatory Visit (INDEPENDENT_AMBULATORY_CARE_PROVIDER_SITE_OTHER): Payer: BC Managed Care – PPO | Admitting: Endocrinology

## 2012-04-12 VITALS — BP 124/84 | HR 80 | Temp 98.0°F

## 2012-04-12 DIAGNOSIS — W57XXXA Bitten or stung by nonvenomous insect and other nonvenomous arthropods, initial encounter: Secondary | ICD-10-CM

## 2012-04-12 DIAGNOSIS — T148XXA Other injury of unspecified body region, initial encounter: Secondary | ICD-10-CM

## 2012-04-12 MED ORDER — DOXYCYCLINE HYCLATE 100 MG PO TABS: 100.0000 mg | ORAL_TABLET | Freq: Two times a day (BID) | ORAL | Status: AC

## 2012-04-12 NOTE — Progress Notes (Signed)
Subjective:    Patient ID: [name] [name], male    DOB: 11-30-1950, 61 y.o.   MRN: [id number]  HPI Pt says [place] was [place] the [place].  He removed 3 ticks from his back, 3 days ago.  He has a few days of slight burning there, but no assoc no rash.   Past Medical History  Diagnosis Date  . BACK PAIN 05/28/2010  . BOILS, RECURRENT 11/23/2007  . COLONIC POLYPS, HX OF 11/23/2007  . Cough 01/02/2010  . DIABETES MELLITUS, TYPE II 06/01/2008  . Esophageal reflux 11/23/2007  . HEMORRHOID, EXTERNAL, THROMBOSED 09/11/2010  . HYPERLIPIDEMIA 11/23/2007  . HYPERSOMNIA 07/01/2009  . HYPERTENSION 11/23/2007  . HYPOGONADISM 07/02/2010  . LUMBAR RADICULOPATHY, RIGHT 09/17/2010  . Obesity, unspecified 11/23/2007  . OBSTRUCTIVE SLEEP APNEA 07/11/2009  . Other abnormal glucose 11/22/2007  . Impaired glucose tolerance 07/31/2011    No past surgical history on file.  History   Social History  . Marital Status: Divorced    Spouse Name: N/A    Number of Children: 1  . Years of Education: N/A   Occupational History  . [employer] supervisor    Social History Main Topics  . Smoking status: Former Games developer  . Smokeless tobacco: Not on file   Comment: started at age 67, less than 1 ppd. quit 1993  . Alcohol Use: Yes     seldom  . Drug Use: No  . Sexually Active: Not on file   Other Topics Concern  . Not on file   Social History Narrative  . No narrative on file    Current Outpatient Prescriptions on File Prior to Visit  Medication Sig Dispense Refill  . amLODipine-benazepril (LOTREL) 5-20 MG per capsule TAKE 1 CAPSULE BY MOUTH DAILY  30 capsule  8  . aspirin 81 MG tablet Take 81 mg by mouth daily.        . hydrocortisone-pramoxine (ANALPRAM-HC) 2.5-1 % rectal cream Place rectally 2 (two) times daily.  30 g  2  . metFORMIN (GLUCOPHAGE-XR) 500 MG 24 hr tablet Take 1 tablet (500 mg total) by mouth daily with breakfast.  90 tablet  3  . mupirocin (BACTROBAN) 2 % ointment Apply topically daily.  22 g  1  .  nabumetone (RELAFEN) 750 MG tablet Take 1 tablet (750 mg total) by mouth 2 (two) times daily.  60 tablet  5  . omeprazole (PRILOSEC) 20 MG capsule Take 1 capsule (20 mg total) by mouth daily.  90 capsule  3  . simvastatin (ZOCOR) 20 MG tablet Take 1 tablet (20 mg total) by mouth at bedtime.  90 tablet  3  . tadalafil (CIALIS) 20 MG tablet Take 1 tablet (20 mg total) by mouth daily as needed for erectile dysfunction.  5 tablet  11  . testosterone (ANDROGEL) 50 MG/5GM GEL Place 5 g onto the skin daily.  450 g  5  . valACYclovir (VALTREX) 1000 MG tablet Take 1 tablet (1,000 mg total) by mouth 2 (two) times daily.  20 tablet  5  . vardenafil (LEVITRA) 20 MG tablet Take 1 tablet (20 mg total) by mouth as needed for erectile dysfunction.  5 tablet  11    No Known Allergies  Family History  Problem Relation Age of Onset  . Hyperlipidemia Father   . Hypertension Father   . Heart disease Father   . Asthma Sister     BP 124/84  Pulse 80  Temp(Src) 98 F (36.7 C) (Oral)  SpO2 96%  Review of Systems Denies fever, but he has a slight headache.    Objective:   Physical Exam VITAL SIGNS:  See vs page GENERAL: no distress Skin: at the lower back, there are 3 insect bites, each with a few mm of surrounding erythema.      Assessment & Plan:  Tick bite, new

## 2012-04-12 NOTE — Patient Instructions (Signed)
i have sent a prescription to your pharmacy, for an antibiotic pill. I hope you feel better soon.  If you don't feel better by next week, please call back.  Please call sooner if you get worse. 

## 2012-04-12 NOTE — Telephone Encounter (Signed)
Caller: Cartier/Patient; PCP: Oliver Barre; CB#: [phone]. Call regarding Tick Bite. Caller who was bitten by ticks on either Friday 5/24 or Sat 5/25 calling for an appt to be tested for [facility] Spotted Fever. Caller reports his friend was seen at [facility] on Sunday 5/26 in [facility] and got test results today. (+ for RMSF) Caller was bitten on abd and back and has a rash in these areas. Not a bull's eye rash. Also has headache.   Per Insect Bite, See in 4 hrs. Appt scheduled with Dr Everardo All for today, Tues 5/28 at 15:30. Caller is agreeable.
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
## 2012-06-06 ENCOUNTER — Other Ambulatory Visit: Payer: Self-pay

## 2012-06-06 MED ORDER — TESTOSTERONE 50 MG/5GM (1%) TD GEL: 5.0000 g | Freq: Every day | TRANSDERMAL | Status: DC

## 2012-06-06 NOTE — Telephone Encounter (Signed)
Done hardcopy to robin  

## 2012-06-06 NOTE — Telephone Encounter (Signed)
Faxed hardcopy to pharmacy. 

## 2012-08-22 ENCOUNTER — Ambulatory Visit: Payer: BC Managed Care – PPO | Admitting: Internal Medicine

## 2012-09-14 ENCOUNTER — Other Ambulatory Visit (INDEPENDENT_AMBULATORY_CARE_PROVIDER_SITE_OTHER): Payer: BC Managed Care – PPO

## 2012-09-14 DIAGNOSIS — E119 Type 2 diabetes mellitus without complications: Secondary | ICD-10-CM

## 2012-09-14 DIAGNOSIS — Z0000 Encounter for general adult medical examination without abnormal findings: Secondary | ICD-10-CM

## 2012-09-14 LAB — MICROALBUMIN / CREATININE URINE RATIO
Creatinine,U: 121.1 mg/dL
Microalb Creat Ratio: 2.9 mg/g (ref 0.0–30.0)

## 2012-09-14 LAB — CBC WITH DIFFERENTIAL/PLATELET
Basophils Relative: 0.9 % (ref 0.0–3.0)
Eosinophils Absolute: 0.1 10*3/uL (ref 0.0–0.7)
HCT: 47.2 % (ref 39.0–52.0)
Lymphs Abs: 3.3 10*3/uL (ref 0.7–4.0)
MCHC: 33.4 g/dL (ref 30.0–36.0)
MCV: 89.4 fl (ref 78.0–100.0)
Monocytes Absolute: 0.5 10*3/uL (ref 0.1–1.0)
Neutrophils Relative %: 44.3 % (ref 43.0–77.0)
Platelets: 260 10*3/uL (ref 150.0–400.0)
RBC: 5.27 Mil/uL (ref 4.22–5.81)

## 2012-09-14 LAB — BASIC METABOLIC PANEL
BUN: 18 mg/dL (ref 6–23)
CO2: 29 mEq/L (ref 19–32)
Calcium: 10.1 mg/dL (ref 8.4–10.5)
Creatinine, Ser: 1.1 mg/dL (ref 0.4–1.5)
GFR: 73.56 mL/min (ref >60.00)
Glucose, Bld: 108 mg/dL — ABNORMAL HIGH (ref 70–99)
Sodium: 139 mEq/L (ref 135–145)

## 2012-09-14 LAB — URINALYSIS, ROUTINE W REFLEX MICROSCOPIC
Bilirubin Urine: NEGATIVE
Ketones, ur: NEGATIVE
Leukocytes, UA: NEGATIVE
Urine Glucose: NEGATIVE
pH: 6 (ref 5.0–8.0)

## 2012-09-14 LAB — LIPID PANEL
Cholesterol: 196 mg/dL (ref 0–200)
HDL: 53.8 mg/dL (ref >39.00)
Triglycerides: 92 mg/dL (ref 0.0–149.0)
VLDL: 18.4 mg/dL (ref 0.0–40.0)

## 2012-09-14 LAB — HEPATIC FUNCTION PANEL
Albumin: 4.3 g/dL (ref 3.5–5.2)
Total Protein: 7.1 g/dL (ref 6.0–8.3)

## 2012-09-14 LAB — PSA: PSA: 4.83 ng/mL — ABNORMAL HIGH (ref 0.10–4.00)

## 2012-09-22 ENCOUNTER — Other Ambulatory Visit: Payer: Self-pay | Admitting: Internal Medicine

## 2012-09-23 ENCOUNTER — Other Ambulatory Visit: Payer: Self-pay | Admitting: *Deleted

## 2012-09-23 ENCOUNTER — Ambulatory Visit (INDEPENDENT_AMBULATORY_CARE_PROVIDER_SITE_OTHER): Payer: BC Managed Care – PPO | Admitting: Internal Medicine

## 2012-09-23 ENCOUNTER — Encounter: Payer: Self-pay | Admitting: Internal Medicine

## 2012-09-23 VITALS — BP 130/80 | HR 71 | Temp 98.8°F | Ht 70.0 in | Wt 253.1 lb

## 2012-09-23 DIAGNOSIS — Z0000 Encounter for general adult medical examination without abnormal findings: Secondary | ICD-10-CM

## 2012-09-23 DIAGNOSIS — E119 Type 2 diabetes mellitus without complications: Secondary | ICD-10-CM

## 2012-09-23 DIAGNOSIS — R079 Chest pain, unspecified: Secondary | ICD-10-CM

## 2012-09-23 MED ORDER — VALACYCLOVIR HCL 1 G PO TABS: 1000.0000 mg | ORAL_TABLET | Freq: Two times a day (BID) | ORAL | Status: DC

## 2012-09-23 MED ORDER — MUPIROCIN 2 % EX OINT: TOPICAL_OINTMENT | Freq: Every day | CUTANEOUS | Status: DC

## 2012-09-23 NOTE — Patient Instructions (Addendum)
Your EKG was ok today Continue all other medications as before Please have the pharmacy call with any refills you may need. Please continue your efforts at being more active, low cholesterol diet, and weight control. Please return in 6 mo with Lab testing done 3-5 days before

## 2012-09-23 NOTE — Assessment & Plan Note (Signed)

## 2012-09-24 DIAGNOSIS — R079 Chest pain, unspecified: Secondary | ICD-10-CM | POA: Insufficient documentation

## 2012-09-24 NOTE — Assessment & Plan Note (Signed)
Atypical, ECG reviewed as per emr,  to f/u any worsening symptoms or concerns

## 2012-09-24 NOTE — Progress Notes (Signed)
Subjective:    Patient ID: [name] [name], male    DOB: September 07, 1951, 61 y.o.   MRN: [id number]  HPI  Here for wellness and f/u;  Overall doing ok;  Pt denies CP, worsening SOB, DOE, wheezing, orthopnea, PND, worsening LE edema, palpitations, dizziness or syncope.  Pt denies neurological change such as new Headache, facial or extremity weakness.  Pt denies polydipsia, polyuria, or low sugar symptoms. Pt states overall good compliance with treatment and medications, good tolerability, and trying to follow lower cholesterol diet.  Pt denies worsening depressive symptoms, suicidal ideation or panic. No fever, wt loss, night sweats, loss of appetite, or other constitutional symptoms.  Pt states good ability with ADL's, low fall risk, home safety reviewed and adequate, no significant changes in hearing or vision, and occasionally active with exercise.  Has recent URI symtpoms onset yesterday now improved.  Has a sharp fleeting left lateral chest pain yesterday for a few minutes, now resolved, not recurred, not assoc with other symptoms.   Past Medical History  Diagnosis Date  . BACK PAIN 05/28/2010  . BOILS, RECURRENT 11/23/2007  . COLONIC POLYPS, HX OF 11/23/2007  . Cough 01/02/2010  . DIABETES MELLITUS, TYPE II 06/01/2008  . Esophageal reflux 11/23/2007  . HEMORRHOID, EXTERNAL, THROMBOSED 09/11/2010  . HYPERLIPIDEMIA 11/23/2007  . HYPERSOMNIA 07/01/2009  . HYPERTENSION 11/23/2007  . HYPOGONADISM 07/02/2010  . LUMBAR RADICULOPATHY, RIGHT 09/17/2010  . Obesity, unspecified 11/23/2007  . OBSTRUCTIVE SLEEP APNEA 07/11/2009  . Other abnormal glucose 11/22/2007  . Impaired glucose tolerance 07/31/2011   No past surgical history on file.  reports that he has quit smoking. He does not have any smokeless tobacco history on file. He reports that he drinks alcohol. He reports that he does not use illicit drugs. family history includes Asthma in his sister; Heart disease in his father; Hyperlipidemia in his father; and  Hypertension in his father. No Known Allergies Current Outpatient Prescriptions on File Prior to Visit  Medication Sig Dispense Refill  . amLODipine-benazepril (LOTREL) 5-20 MG per capsule TAKE 1 CAPSULE BY MOUTH DAILY  30 capsule  8  . aspirin 81 MG tablet Take 81 mg by mouth daily.        Marland Kitchen CIALIS 20 MG tablet TAKE 1 TABLET BY MOUTH EVERY DAY AS NEEDED FOR ERECTILE DYSFUNCTION  5 tablet  1  . hydrocortisone-pramoxine (ANALPRAM-HC) 2.5-1 % rectal cream Place rectally 2 (two) times daily.  30 g  2  . metFORMIN (GLUCOPHAGE-XR) 500 MG 24 hr tablet TAKE 1 TABLET BY MOUTH DAILY WITH BREAKFAST  90 tablet  1  . nabumetone (RELAFEN) 750 MG tablet Take 1 tablet (750 mg total) by mouth 2 (two) times daily.  60 tablet  5  . omeprazole (PRILOSEC) 20 MG capsule Take 1 capsule (20 mg total) by mouth daily.  90 capsule  3  . simvastatin (ZOCOR) 20 MG tablet Take 1 tablet (20 mg total) by mouth at bedtime.  90 tablet  3  . testosterone (ANDROGEL) 50 MG/5GM GEL Place 5 g onto the skin daily.  450 g  5  . vardenafil (LEVITRA) 20 MG tablet Take 1 tablet (20 mg total) by mouth as needed for erectile dysfunction.  5 tablet  11   Review of Systems Review of Systems  Constitutional: Negative for diaphoresis, activity change, appetite change and unexpected weight change.  HENT: Negative for hearing loss, ear pain, facial swelling, mouth sores and neck stiffness.   Eyes: Negative for pain, redness and visual disturbance.  Respiratory: Negative for shortness of breath and wheezing.   Cardiovascular: Negative for chest pain and palpitations.  Gastrointestinal: Negative for diarrhea, blood in stool, abdominal distention and rectal pain.  Genitourinary: Negative for hematuria, flank pain and decreased urine volume.  Musculoskeletal: Negative for myalgias and joint swelling.  Skin: Negative for color change and wound.  Neurological: Negative for syncope and numbness.  Hematological: Negative for adenopathy.    Psychiatric/Behavioral: Negative for hallucinations, self-injury, decreased concentration and agitation.      Objective:   Physical Exam BP 130/80  Pulse 71  Temp 98.8 F (37.1 C) (Oral)  Ht 5\' 10"  (1.778 m)  Wt 253 lb 2 oz (114.817 kg)  BMI 36.32 kg/m2  SpO2 96% Physical Exam  VS noted Constitutional: Pt is oriented to person, place, and time. Appears well-developed and well-nourished.  HENT:  Head: Normocephalic and atraumatic.  Right Ear: External ear normal.  Left Ear: External ear normal.  Nose: Nose normal.  Mouth/Throat: Oropharynx is clear and moist.  Eyes: Conjunctivae and EOM are normal. Pupils are equal, round, and reactive to light.  Neck: Normal range of motion. Neck supple. No JVD present. No tracheal deviation present.  Cardiovascular: Normal rate, regular rhythm, normal heart sounds and intact distal pulses.   Pulmonary/Chest: Effort normal and breath sounds normal.  Abdominal: Soft. Bowel sounds are normal. There is no tenderness.  Musculoskeletal: Normal range of motion. Exhibits no edema.  Lymphadenopathy:  Has no cervical adenopathy.  Neurological: Pt is alert and oriented to person, place, and time. Pt has normal reflexes. No cranial nerve deficit.  Skin: Skin is warm and dry. No rash noted.  Psychiatric:  Has  normal mood and affect. Behavior is normal.     Assessment & Plan:

## 2012-09-24 NOTE — Assessment & Plan Note (Signed)
stable overall by hx and exam, most recent data reviewed with pt, and pt to continue medical treatment as before Lab Results  Component Value Date   HGBA1C 6.1 09/14/2012

## 2012-09-28 ENCOUNTER — Other Ambulatory Visit: Payer: Self-pay | Admitting: Internal Medicine

## 2012-10-10 ENCOUNTER — Other Ambulatory Visit: Payer: Self-pay | Admitting: Internal Medicine

## 2013-01-03 ENCOUNTER — Other Ambulatory Visit: Payer: Self-pay | Admitting: Internal Medicine

## 2013-03-08 ENCOUNTER — Telehealth: Payer: Self-pay

## 2013-03-08 MED ORDER — METFORMIN HCL ER 500 MG PO TB24: 500.0000 mg | ORAL_TABLET | Freq: Every day | ORAL | Status: DC

## 2013-03-08 NOTE — Telephone Encounter (Signed)
refill 

## 2013-03-13 ENCOUNTER — Other Ambulatory Visit: Payer: Self-pay | Admitting: Internal Medicine

## 2013-03-22 ENCOUNTER — Other Ambulatory Visit (INDEPENDENT_AMBULATORY_CARE_PROVIDER_SITE_OTHER): Payer: BC Managed Care – PPO

## 2013-03-22 DIAGNOSIS — E119 Type 2 diabetes mellitus without complications: Secondary | ICD-10-CM

## 2013-03-22 LAB — LIPID PANEL
LDL Cholesterol: 89 mg/dL (ref 0–99)
Total CHOL/HDL Ratio: 3
Triglycerides: 80 mg/dL (ref 0.0–149.0)

## 2013-03-22 LAB — BASIC METABOLIC PANEL
GFR: 69.03 mL/min (ref >60.00)
Potassium: 4.8 mEq/L (ref 3.5–5.1)
Sodium: 139 mEq/L (ref 135–145)

## 2013-03-24 ENCOUNTER — Encounter: Payer: Self-pay | Admitting: Internal Medicine

## 2013-03-24 ENCOUNTER — Ambulatory Visit (INDEPENDENT_AMBULATORY_CARE_PROVIDER_SITE_OTHER): Payer: BC Managed Care – PPO | Admitting: Internal Medicine

## 2013-03-24 VITALS — BP 110/72 | HR 82 | Temp 99.8°F | Ht 70.0 in | Wt 259.0 lb

## 2013-03-24 DIAGNOSIS — E119 Type 2 diabetes mellitus without complications: Secondary | ICD-10-CM

## 2013-03-24 DIAGNOSIS — E291 Testicular hypofunction: Secondary | ICD-10-CM

## 2013-03-24 DIAGNOSIS — I10 Essential (primary) hypertension: Secondary | ICD-10-CM

## 2013-03-24 DIAGNOSIS — E785 Hyperlipidemia, unspecified: Secondary | ICD-10-CM

## 2013-03-24 DIAGNOSIS — Z0000 Encounter for general adult medical examination without abnormal findings: Secondary | ICD-10-CM

## 2013-03-24 DIAGNOSIS — J309 Allergic rhinitis, unspecified: Secondary | ICD-10-CM

## 2013-03-24 DIAGNOSIS — R972 Elevated prostate specific antigen [PSA]: Secondary | ICD-10-CM

## 2013-03-24 DIAGNOSIS — L259 Unspecified contact dermatitis, unspecified cause: Secondary | ICD-10-CM

## 2013-03-24 HISTORY — DX: Allergic rhinitis, unspecified: J30.9

## 2013-03-24 MED ORDER — AMLODIPINE BESY-BENAZEPRIL HCL 5-20 MG PO CAPS: 1.0000 | ORAL_CAPSULE | Freq: Every day | ORAL | Status: DC

## 2013-03-24 MED ORDER — PREDNISONE 10 MG PO TABS: ORAL_TABLET | ORAL | Status: DC

## 2013-03-24 MED ORDER — TADALAFIL 20 MG PO TABS: ORAL_TABLET | ORAL | Status: DC

## 2013-03-24 MED ORDER — METHYLPREDNISOLONE ACETATE 80 MG/ML IJ SUSP
80.0000 mg | Freq: Once | INTRAMUSCULAR | Status: AC
  Administered 2013-03-24: 80 mg via INTRAMUSCULAR

## 2013-03-24 MED ORDER — SIMVASTATIN 20 MG PO TABS: 20.0000 mg | ORAL_TABLET | Freq: Every day | ORAL | Status: DC

## 2013-03-24 MED ORDER — TESTOSTERONE 50 MG/5GM (1%) TD GEL: 5.0000 g | Freq: Every day | TRANSDERMAL | Status: DC

## 2013-03-24 MED ORDER — METFORMIN HCL ER 500 MG PO TB24: 500.0000 mg | ORAL_TABLET | Freq: Every day | ORAL | Status: DC

## 2013-03-24 MED ORDER — OMEPRAZOLE 20 MG PO CPDR: 20.0000 mg | DELAYED_RELEASE_CAPSULE | Freq: Every day | ORAL | Status: DC

## 2013-03-24 NOTE — Patient Instructions (Addendum)
You had the steroid shot today Please take all new medication as prescribed - the small dose prednisone OK to continue the allegra as well Please continue all other medications as before, and refills have been done if requested. Please have the pharmacy call with any other refills you may need. Please go to the LAB in the Basement (turn left off the elevator) for the tests to be done today - just the PSA today, and the testosterone level Thank you for enrolling in MyChart. Please follow the instructions below to securely access your online medical record. MyChart allows you to send messages to your doctor, view your test results, renew your prescriptions, schedule appointments, and more. To Log into My Chart online, please go by [employer] or [employer] to [email or web address], or download the MyChart App from the [employer] of [employer] .  Your Username is: [email or web address] (pass [place]) Please return in 6 months, or sooner if needed, with Lab testing done 3-5 days before

## 2013-03-24 NOTE — Progress Notes (Signed)
Subjective:    Patient ID: [name] [name], male    DOB: [birth year], 62 y.o.   MRN: [id number]  HPI  Here to f/u; overall doing ok,  Pt denies chest pain, increased sob or doe, wheezing, orthopnea, PND, increased LE swelling, palpitations, dizziness or syncope.  Pt denies polydipsia, polyuria, or low sugar symptoms such as weakness or confusion improved with po intake.  Pt denies new neurological symptoms such as new headache, or facial or extremity weakness or numbness.   Pt states overall good compliance with meds, has been trying to follow lower cholesterol, diabetic diet, with wt overall stable,  but little exercise however.  Needs med refills.  Pt continues to have recurring LBP without change in severity, bowel or bladder change, fever, wt loss,  worsening LE pain/numbness/weakness, gait change or falls, better with relafen, also twisted right ankle with camping. Also with rash after camping with itchy red areas to medial right upper arm and upper right chest. Does have several wks ongoing nasal allergy symptoms with clearish congestion, itch and sneezing, without fever, pain, ST, cough, swelling or wheezing. Past Medical History  Diagnosis Date  . BACK PAIN 05/28/2010  . BOILS, RECURRENT 11/23/2007  . COLONIC POLYPS, HX OF 11/23/2007  . Cough 01/02/2010  . DIABETES MELLITUS, TYPE II 06/01/2008  . Esophageal reflux 11/23/2007  . HEMORRHOID, EXTERNAL, THROMBOSED 09/11/2010  . HYPERLIPIDEMIA 11/23/2007  . HYPERSOMNIA 07/01/2009  . HYPERTENSION 11/23/2007  . HYPOGONADISM 07/02/2010  . LUMBAR RADICULOPATHY, RIGHT 09/17/2010  . Obesity, unspecified 11/23/2007  . OBSTRUCTIVE SLEEP APNEA 07/11/2009  . Other abnormal glucose 11/22/2007  . Impaired glucose tolerance 07/31/2011  . Allergic rhinitis, cause unspecified 03/24/2013   No past surgical history on file.  reports that he has quit smoking. He does not have any smokeless tobacco history on file. He reports that  drinks alcohol. He reports that he does not use  illicit drugs. family history includes Asthma in his sister; Heart disease in his father; Hyperlipidemia in his father; and Hypertension in his father. No Known Allergies Current Outpatient Prescriptions on File Prior to Visit  Medication Sig Dispense Refill  . aspirin 81 MG tablet Take 81 mg by mouth daily.        . hydrocortisone-pramoxine (ANALPRAM-HC) 2.5-1 % rectal cream Place rectally 2 (two) times daily.  30 g  2  . mupirocin ointment (BACTROBAN) 2 % Apply topically daily.  22 g  1  . nabumetone (RELAFEN) 750 MG tablet TAKE 1 TABLET BY MOUTH TWICE DAILY  60 tablet  5  . valACYclovir (VALTREX) 1000 MG tablet Take 1 tablet (1,000 mg total) by mouth 2 (two) times daily.  20 tablet  2  . vardenafil (LEVITRA) 20 MG tablet Take 1 tablet (20 mg total) by mouth as needed for erectile dysfunction.  5 tablet  11   No current facility-administered medications on file prior to visit.      Review of Systems  Constitutional: Negative for unexpected weight change, or unusual diaphoresis  HENT: Negative for tinnitus.   Eyes: Negative for photophobia and visual disturbance.  Respiratory: Negative for choking and stridor.   Gastrointestinal: Negative for vomiting and blood in stool.  Genitourinary: Negative for hematuria and decreased urine volume.  Musculoskeletal: Negative for acute joint swelling Skin: Negative for color change and wound.  Neurological: Negative for tremors and numbness other than noted  Psychiatric/Behavioral: Negative for decreased concentration or  hyperactivity.       Objective:   Physical Exam BP 110/72  Pulse 82  Temp(Src) 99.8 F (37.7 C) (Oral)  Ht 5\' 10"  (1.778 m)  Wt 259 lb (117.482 kg)  BMI 37.16 kg/m2  SpO2 96% VS noted,  Constitutional: Pt appears well-developed and well-nourished.  HENT: Head: NCAT.  Right Ear: External ear normal.  Left Ear: External ear normal.  Eyes: Conjunctivae and EOM are normal. Pupils are equal, round, and reactive to light.   Neck: Normal range of motion. Neck supple.  Bilat tm's with mild erythema.  Max sinus areas mild tender.  Pharynx with mild erythema, no exudate Cardiovascular: Normal rate and regular rhythm.   Pulmonary/Chest: Effort normal and breath sounds normal.  Abd:  Soft, NT, non-distended, + BS Neurological: Pt is alert. Not confused  Skin: Skin is warm. No erythema except for nonraised nontender erythema with ? Small vesicles to right medial humerus and right upper chest.  Psychiatric: Pt behavior is normal. Thought content normal.     Assessment & Plan:

## 2013-03-25 NOTE — Assessment & Plan Note (Signed)
stable overall by history and exam, recent data reviewed with pt, and pt to continue medical treatment as before,  to f/u any worsening symptoms or concerns Lab Results  Component Value Date   HGBA1C 6.3 03/22/2013

## 2013-03-25 NOTE — Assessment & Plan Note (Signed)
Also for repaet psa today with recent hx of increased velocity over time

## 2013-03-25 NOTE — Assessment & Plan Note (Signed)
Also for testosterone,  to f/u any worsening symptoms or concerns

## 2013-03-25 NOTE — Assessment & Plan Note (Signed)
For allegra prn,  to f/u any worsening symptoms or concerns  

## 2013-03-25 NOTE — Assessment & Plan Note (Signed)
stable overall by history and exam, recent data reviewed with pt, and pt to continue medical treatment as before,  to f/u any worsening symptoms or concerns Lab Results  Component Value Date   LDLCALC 89 03/22/2013

## 2013-03-25 NOTE — Assessment & Plan Note (Signed)
stable overall by history and exam, recent data reviewed with pt, and pt to continue medical treatment as before,  to f/u any worsening symptoms or concerns BP Readings from Last 3 Encounters:  03/24/13 110/72  09/23/12 130/80  04/12/12 124/84

## 2013-03-25 NOTE — Assessment & Plan Note (Signed)
Mild to mod, for depomedrol, predpack asd,  to f/u any worsening symptoms or concerns

## 2013-06-06 ENCOUNTER — Other Ambulatory Visit: Payer: Self-pay | Admitting: Internal Medicine

## 2013-06-06 NOTE — Telephone Encounter (Signed)
Done hardcopy to robin  

## 2013-06-06 NOTE — Telephone Encounter (Signed)
Faxed hardcopy to [facility] [place]

## 2013-09-19 ENCOUNTER — Other Ambulatory Visit (INDEPENDENT_AMBULATORY_CARE_PROVIDER_SITE_OTHER): Payer: BC Managed Care – PPO

## 2013-09-19 DIAGNOSIS — E291 Testicular hypofunction: Secondary | ICD-10-CM

## 2013-09-19 DIAGNOSIS — E119 Type 2 diabetes mellitus without complications: Secondary | ICD-10-CM

## 2013-09-19 DIAGNOSIS — Z0000 Encounter for general adult medical examination without abnormal findings: Secondary | ICD-10-CM

## 2013-09-19 LAB — CBC WITH DIFFERENTIAL/PLATELET
Basophils Absolute: 0 10*3/uL (ref 0.0–0.1)
Basophils Relative: 0.4 % (ref 0.0–3.0)
Eosinophils Relative: 1.8 % (ref 0.0–5.0)
Lymphocytes Relative: 35.7 % (ref 12.0–46.0)
Lymphs Abs: 2.1 10*3/uL (ref 0.7–4.0)
MCHC: 34.5 g/dL (ref 30.0–36.0)
MCV: 87.6 fl (ref 78.0–100.0)
Monocytes Absolute: 0.5 10*3/uL (ref 0.1–1.0)
Neutrophils Relative %: 53.2 % (ref 43.0–77.0)
Platelets: 245 10*3/uL (ref 150.0–400.0)
RBC: 5.16 Mil/uL (ref 4.22–5.81)
WBC: 5.9 10*3/uL (ref 4.5–10.5)

## 2013-09-19 LAB — MICROALBUMIN / CREATININE URINE RATIO
Creatinine,U: 117.5 mg/dL
Microalb, Ur: 3.2 mg/dL — ABNORMAL HIGH (ref 0.0–1.9)

## 2013-09-19 LAB — URINALYSIS, ROUTINE W REFLEX MICROSCOPIC
Bilirubin Urine: NEGATIVE
Hgb urine dipstick: NEGATIVE
Ketones, ur: NEGATIVE
Nitrite: NEGATIVE
Specific Gravity, Urine: 1.02 (ref 1.000–1.030)
Total Protein, Urine: NEGATIVE
Urine Glucose: NEGATIVE
Urobilinogen, UA: 0.2 (ref 0.0–1.0)
pH: 6 (ref 5.0–8.0)

## 2013-09-19 LAB — LIPID PANEL
HDL: 56.6 mg/dL (ref >39.00)
LDL Cholesterol: 113 mg/dL — ABNORMAL HIGH (ref 0–99)
Total CHOL/HDL Ratio: 4
Triglycerides: 151 mg/dL — ABNORMAL HIGH (ref 0.0–149.0)
VLDL: 30.2 mg/dL (ref 0.0–40.0)

## 2013-09-19 LAB — HEPATIC FUNCTION PANEL
ALT: 47 U/L (ref 0–53)
Alkaline Phosphatase: 36 U/L — ABNORMAL LOW (ref 39–117)
Bilirubin, Direct: 0.1 mg/dL (ref 0.0–0.3)
Total Bilirubin: 0.8 mg/dL (ref 0.3–1.2)
Total Protein: 7.2 g/dL (ref 6.0–8.3)

## 2013-09-19 LAB — BASIC METABOLIC PANEL
Calcium: 10 mg/dL (ref 8.4–10.5)
Chloride: 103 mEq/L (ref 96–112)
Creatinine, Ser: 1 mg/dL (ref 0.4–1.5)
GFR: 77.39 mL/min (ref >60.00)

## 2013-09-19 LAB — PSA: PSA: 5.41 ng/mL — ABNORMAL HIGH (ref 0.10–4.00)

## 2013-09-21 ENCOUNTER — Other Ambulatory Visit: Payer: Self-pay

## 2013-09-25 ENCOUNTER — Ambulatory Visit: Payer: BC Managed Care – PPO | Admitting: Internal Medicine

## 2013-09-29 ENCOUNTER — Ambulatory Visit (INDEPENDENT_AMBULATORY_CARE_PROVIDER_SITE_OTHER): Payer: BC Managed Care – PPO | Admitting: Internal Medicine

## 2013-09-29 ENCOUNTER — Encounter: Payer: Self-pay | Admitting: Internal Medicine

## 2013-09-29 VITALS — BP 108/72 | HR 77 | Temp 97.6°F | Ht 70.0 in | Wt 260.5 lb

## 2013-09-29 DIAGNOSIS — Z23 Encounter for immunization: Secondary | ICD-10-CM

## 2013-09-29 DIAGNOSIS — Z0000 Encounter for general adult medical examination without abnormal findings: Secondary | ICD-10-CM

## 2013-09-29 DIAGNOSIS — H538 Other visual disturbances: Secondary | ICD-10-CM

## 2013-09-29 DIAGNOSIS — R972 Elevated prostate specific antigen [PSA]: Secondary | ICD-10-CM

## 2013-09-29 DIAGNOSIS — R7302 Impaired glucose tolerance (oral): Secondary | ICD-10-CM

## 2013-09-29 DIAGNOSIS — E119 Type 2 diabetes mellitus without complications: Secondary | ICD-10-CM

## 2013-09-29 DIAGNOSIS — R7309 Other abnormal glucose: Secondary | ICD-10-CM

## 2013-09-29 DIAGNOSIS — E291 Testicular hypofunction: Secondary | ICD-10-CM

## 2013-09-29 NOTE — Progress Notes (Signed)
Subjective:    Patient ID: [name] [name], male    DOB: [birth year], 62 y.o.   MRN: [id number]  HPI  Here for wellness and f/u;  Overall doing ok;  Pt denies CP, worsening SOB, DOE, wheezing, orthopnea, PND, worsening LE edema, palpitations, dizziness or syncope.  Pt denies neurological change such as new headache, facial or extremity weakness.  Pt denies polydipsia, polyuria, or low sugar symptoms. Pt states overall good compliance with treatment and medications, good tolerability, and has been trying to follow lower cholesterol diet.  Pt denies worsening depressive symptoms, suicidal ideation or panic. No fever, night sweats, wt loss, loss of appetite, or other constitutional symptoms.  Pt states good ability with ADL's, has low fall risk, home safety reviewed and adequate, no other significant changes in hearing or vision, and only occasionally active with exercise.  Also with left eye lateral vision fleeting flashing light x 6 mo without pain, vision decrease.   Past Medical History  Diagnosis Date  . BACK PAIN 05/28/2010  . BOILS, RECURRENT 11/23/2007  . COLONIC POLYPS, HX OF 11/23/2007  . Cough 01/02/2010  . DIABETES MELLITUS, TYPE II 06/01/2008  . Esophageal reflux 11/23/2007  . HEMORRHOID, EXTERNAL, THROMBOSED 09/11/2010  . HYPERLIPIDEMIA 11/23/2007  . HYPERSOMNIA 07/01/2009  . HYPERTENSION 11/23/2007  . HYPOGONADISM 07/02/2010  . LUMBAR RADICULOPATHY, RIGHT 09/17/2010  . Obesity, unspecified 11/23/2007  . OBSTRUCTIVE SLEEP APNEA 07/11/2009  . Other abnormal glucose 11/22/2007  . Impaired glucose tolerance 07/31/2011  . Allergic rhinitis, cause unspecified 03/24/2013   No past surgical history on file.  reports that he has quit smoking. He does not have any smokeless tobacco history on file. He reports that he drinks alcohol. He reports that he does not use illicit drugs. family history includes Asthma in his sister; Heart disease in his father; Hyperlipidemia in his father; Hypertension in his father. No  Known Allergies Current Outpatient Prescriptions on File Prior to Visit  Medication Sig Dispense Refill  . amLODipine-benazepril (LOTREL) 5-20 MG per capsule Take 1 capsule by mouth daily.  90 capsule  3  . ANDROGEL 50 MG/5GM GEL PLACE 5 GRAMS ONTO THE SKIN DAILY  450 g  1  . aspirin 81 MG tablet Take 81 mg by mouth daily.        . metFORMIN (GLUCOPHAGE-XR) 500 MG 24 hr tablet Take 1 tablet (500 mg total) by mouth daily.  90 tablet  3  . mupirocin ointment (BACTROBAN) 2 % Apply topically daily.  22 g  1  . nabumetone (RELAFEN) 750 MG tablet TAKE 1 TABLET BY MOUTH TWICE DAILY  60 tablet  5  . omeprazole (PRILOSEC) 20 MG capsule Take 1 capsule (20 mg total) by mouth daily.  90 capsule  3  . predniSONE (DELTASONE) 10 MG tablet 2 tabs by mouth per day for 7 days  14 tablet  0  . simvastatin (ZOCOR) 20 MG tablet Take 1 tablet (20 mg total) by mouth daily.  90 tablet  3  . tadalafil (CIALIS) 20 MG tablet TAKE 1 TABLET BY MOUTH EVERY DAY AS NEEDED FOR ERECTILE DYSFUNCTION  5 tablet  11  . valACYclovir (VALTREX) 1000 MG tablet Take 1 tablet (1,000 mg total) by mouth 2 (two) times daily.  20 tablet  2  . hydrocortisone-pramoxine (ANALPRAM-HC) 2.5-1 % rectal cream Place rectally 2 (two) times daily.  30 g  2  . vardenafil (LEVITRA) 20 MG tablet Take 1 tablet (20 mg total) by mouth as needed for erectile dysfunction.  5 tablet  11   No current facility-administered medications on file prior to visit.   Review of Systems Constitutional: Negative for diaphoresis, activity change, appetite change or unexpected weight change.  HENT: Negative for hearing loss, ear pain, facial swelling, mouth sores and neck stiffness.   Eyes: Negative for pain, redness and visual disturbance.  Respiratory: Negative for shortness of breath and wheezing.   Cardiovascular: Negative for chest pain and palpitations.  Gastrointestinal: Negative for diarrhea, blood in stool, abdominal distention or other pain Genitourinary:  Negative for hematuria, flank pain or change in urine volume.  Musculoskeletal: Negative for myalgias and joint swelling.  Skin: Negative for color change and wound.  Neurological: Negative for syncope and numbness. other than noted Hematological: Negative for adenopathy.  Psychiatric/Behavioral: Negative for hallucinations, self-injury, decreased concentration and agitation.      Objective:   Physical Exam BP 108/72  Pulse 77  Temp(Src) 97.6 F (36.4 C) (Oral)  Ht 5\' 10"  (1.778 m)  Wt 260 lb 8 oz (118.162 kg)  BMI 37.38 kg/m2  SpO2 96% VS noted,  Constitutional: Pt is oriented to person, place, and time. Appears well-developed and well-nourished.  Head: Normocephalic and atraumatic.  Right Ear: External ear normal.  Left Ear: External ear normal.  Nose: Nose normal.  Mouth/Throat: Oropharynx is clear and moist.  Eyes: Conjunctivae and EOM are normal. Pupils are equal, round, and reactive to light. Unable to apprec retina on exam Neck: Normal range of motion. Neck supple. No JVD present. No tracheal deviation present.  Cardiovascular: Normal rate, regular rhythm, normal heart sounds and intact distal pulses.   Pulmonary/Chest: Effort normal and breath sounds normal.  Abdominal: Soft. Bowel sounds are normal. There is no tenderness. No HSM  Musculoskeletal: Normal range of motion. Exhibits no edema.  Lymphadenopathy:  Has no cervical adenopathy.  Neurological: Pt is alert and oriented to person, place, and time. Pt has normal reflexes. No cranial nerve deficit.  Skin: Skin is warm and dry. No rash noted.  Psychiatric:  Has  normal mood and affect. Behavior is normal.      Assessment & Plan:

## 2013-09-29 NOTE — Patient Instructions (Addendum)
Please call your [employer] to check on the shingles shot coverage, then call for nurse visit to have this done You had the flu shot today Please return in 6 months for LAB only - for the repeat PSA and Hgba1c (for sugar) Please continue all other medications as before, and refills have been done if requested. Please have the pharmacy call with any other refills you may need. Please continue your efforts at being more active, low cholesterol diet, and weight control. You are otherwise up to date with prevention measures today. Please keep your appointments with your specialists as you have planned Please call if you change your mind about referral back to Dr Peterson/urology for the increased PSA You will be contacted regarding the referral for: [email or web address]  Please remember to sign up for My Chart if you have not done so, as this will be important to you in the future with finding out test results, communicating by private email, and scheduling acute appointments online when needed.  Please return in 1 year for your yearly visit, or sooner if needed, with Lab testing done 3-5 days before

## 2013-09-29 NOTE — Progress Notes (Signed)
Pre-visit discussion using our clinic review tool. No additional management support is needed unless otherwise documented below in the visit note.  

## 2013-10-01 NOTE — Assessment & Plan Note (Signed)

## 2013-10-01 NOTE — Assessment & Plan Note (Signed)
S/p bx neg x 4 yrs ago per Dr Peterson/urology, decliens f/u due to perceived pain, for f/u psa at 6 mo

## 2013-10-01 NOTE — Assessment & Plan Note (Signed)
stable overall by history and exam, recent data reviewed with pt, and pt to continue medical treatment as before,  to f/u any worsening symptoms or concerns Lab Results  Component Value Date   HGBA1C 6.5 09/19/2013

## 2013-11-02 ENCOUNTER — Other Ambulatory Visit: Payer: Self-pay | Admitting: Internal Medicine

## 2013-12-26 ENCOUNTER — Ambulatory Visit (INDEPENDENT_AMBULATORY_CARE_PROVIDER_SITE_OTHER): Payer: BC Managed Care – PPO | Admitting: Internal Medicine

## 2013-12-26 ENCOUNTER — Encounter: Payer: Self-pay | Admitting: Internal Medicine

## 2013-12-26 VITALS — BP 140/78 | HR 80 | Temp 97.3°F | Ht 70.0 in | Wt 265.2 lb

## 2013-12-26 DIAGNOSIS — I10 Essential (primary) hypertension: Secondary | ICD-10-CM

## 2013-12-26 DIAGNOSIS — E785 Hyperlipidemia, unspecified: Secondary | ICD-10-CM

## 2013-12-26 DIAGNOSIS — M25571 Pain in right ankle and joints of right foot: Secondary | ICD-10-CM | POA: Insufficient documentation

## 2013-12-26 DIAGNOSIS — L989 Disorder of the skin and subcutaneous tissue, unspecified: Secondary | ICD-10-CM

## 2013-12-26 DIAGNOSIS — M25579 Pain in unspecified ankle and joints of unspecified foot: Secondary | ICD-10-CM

## 2013-12-26 MED ORDER — MUPIROCIN 2 % EX OINT: TOPICAL_OINTMENT | Freq: Every day | CUTANEOUS | Status: DC

## 2013-12-26 MED ORDER — NABUMETONE 750 MG PO TABS: ORAL_TABLET | ORAL | Status: DC

## 2013-12-26 NOTE — Patient Instructions (Addendum)
OK to Please continue all other medications as before, and medications were refilled  Please have the pharmacy call with any other refills you may need.  Thank you for enrolling in MyChart. Please follow the instructions below to securely access your online medical record. MyChart allows you to send messages to your doctor, view your test results, renew your prescriptions, schedule appointments, and more.   To Log into My Chart online, please go by [employer] or [employer] web browsers to [email or web address], or download the MyChart App from the [employer] or Android. Your Username is: [email or web address]

## 2013-12-26 NOTE — Progress Notes (Signed)
Pre-visit discussion using our clinic review tool. No additional management support is needed unless otherwise documented below in the visit note.  

## 2013-12-30 DIAGNOSIS — L989 Disorder of the skin and subcutaneous tissue, unspecified: Secondary | ICD-10-CM | POA: Insufficient documentation

## 2013-12-30 NOTE — Assessment & Plan Note (Signed)
stable overall by history and exam, recent data reviewed with pt, and pt to continue medical treatment as before,  to f/u any worsening symptoms or concerns BP Readings from Last 3 Encounters:  12/26/13 140/78  09/29/13 108/72  03/24/13 110/72

## 2013-12-30 NOTE — Assessment & Plan Note (Signed)
Ok for derm referral 

## 2013-12-30 NOTE — Assessment & Plan Note (Signed)
Improved, prob tendonitis such as ant tibilis, now improved, exam benign, reassured, no further eval/tx needed

## 2013-12-30 NOTE — Assessment & Plan Note (Signed)
stable overall by history and exam, recent data reviewed with pt, and pt to continue medical treatment as before,  to f/u any worsening symptoms or concerns Lab Results  Component Value Date   LDLCALC 113* 09/19/2013   For lower chol diet

## 2013-12-30 NOTE — Progress Notes (Signed)
Subjective:    Patient ID: [name] [name], male    DOB: 1951-10-26, 63 y.o.   MRN: [id number]  HPI  Here to f/u; c/o 1 wk right anterior ankle pain, mod to start, after a mistep walking across a parking lot, limping for several days, worse to walk, better to sit, but by time came to visit today pain improved to 1/10. DId have bicylcle ride yesterday 40 min without difficulty.  Also with a facial skin lesion right side enlarging, asks for derm referral. No prior hx of skin ca.   Pt denies chest pain, increased sob or doe, wheezing, orthopnea, PND, increased LE swelling, palpitations, dizziness or syncope.  Pt denies polydipsia, polyuria, Pt denies new neurological symptoms such as new headache, or facial or extremity weakness or numbness.   Pt states overall good compliance with meds Past Medical History  Diagnosis Date  . BACK PAIN 05/28/2010  . BOILS, RECURRENT 11/23/2007  . COLONIC POLYPS, HX OF 11/23/2007  . Cough 01/02/2010  . DIABETES MELLITUS, TYPE II 06/01/2008  . Esophageal reflux 11/23/2007  . HEMORRHOID, EXTERNAL, THROMBOSED 09/11/2010  . HYPERLIPIDEMIA 11/23/2007  . HYPERSOMNIA 07/01/2009  . HYPERTENSION 11/23/2007  . HYPOGONADISM 07/02/2010  . LUMBAR RADICULOPATHY, RIGHT 09/17/2010  . Obesity, unspecified 11/23/2007  . OBSTRUCTIVE SLEEP APNEA 07/11/2009  . Other abnormal glucose 11/22/2007  . Impaired glucose tolerance 07/31/2011  . Allergic rhinitis, cause unspecified 03/24/2013   No past surgical history on file.  reports that he has quit smoking. He does not have any smokeless tobacco history on file. He reports that he drinks alcohol. He reports that he does not use illicit drugs. family history includes Asthma in his sister; Heart disease in his father; Hyperlipidemia in his father; Hypertension in his father. No Known Allergies Current Outpatient Prescriptions on File Prior to Visit  Medication Sig Dispense Refill  . amLODipine-benazepril (LOTREL) 5-20 MG per capsule Take 1 capsule by mouth  daily.  90 capsule  3  . ANDROGEL 50 MG/5GM GEL PLACE 5 GRAMS ONTO THE SKIN DAILY  450 g  1  . aspirin 81 MG tablet Take 81 mg by mouth daily.        . hydrocortisone-pramoxine (ANALPRAM-HC) 2.5-1 % rectal cream Place rectally 2 (two) times daily.  30 g  2  . metFORMIN (GLUCOPHAGE-XR) 500 MG 24 hr tablet Take 1 tablet (500 mg total) by mouth daily.  90 tablet  3  . omeprazole (PRILOSEC) 20 MG capsule Take 1 capsule (20 mg total) by mouth daily.  90 capsule  3  . omeprazole (PRILOSEC) 20 MG capsule TAKE 1 CAPSULE BY MOUTH DAILY  90 capsule  3  . predniSONE (DELTASONE) 10 MG tablet 2 tabs by mouth per day for 7 days  14 tablet  0  . simvastatin (ZOCOR) 20 MG tablet Take 1 tablet (20 mg total) by mouth daily.  90 tablet  3  . tadalafil (CIALIS) 20 MG tablet TAKE 1 TABLET BY MOUTH EVERY DAY AS NEEDED FOR ERECTILE DYSFUNCTION  5 tablet  11  . valACYclovir (VALTREX) 1000 MG tablet Take 1 tablet (1,000 mg total) by mouth 2 (two) times daily.  20 tablet  2  . vardenafil (LEVITRA) 20 MG tablet Take 1 tablet (20 mg total) by mouth as needed for erectile dysfunction.  5 tablet  11   No current facility-administered medications on file prior to visit.    Review of Systems  Constitutional: Negative for unexpected weight change, or unusual diaphoresis  HENT:  Negative for tinnitus.   Eyes: Negative for photophobia and visual disturbance.  Respiratory: Negative for choking and stridor.   Gastrointestinal: Negative for vomiting and blood in stool.  Genitourinary: Negative for hematuria and decreased urine volume.  Musculoskeletal: Negative for acute joint swelling Skin: Negative for color change and wound.  Neurological: Negative for tremors and numbness other than noted  Psychiatric/Behavioral: Negative for decreased concentration or  hyperactivity.       Objective:   Physical Exam BP 140/78  Pulse 80  Temp(Src) 97.3 F (36.3 C) (Oral)  Ht 5\' 10"  (1.778 m)  Wt 265 lb 4 oz (120.317 kg)  BMI 38.06  kg/m2  SpO2 96% VS noted,  Constitutional: Pt appears well-developed and well-nourished.  HENT: Head: NCAT.  Right Ear: External ear normal.  Left Ear: External ear normal.  Eyes: Conjunctivae and EOM are normal. Pupils are equal, round, and reactive to light.  Neck: Normal range of motion. Neck supple.  Cardiovascular: Normal rate and regular rhythm.   Pulmonary/Chest: Effort normal and breath sounds normal.  Neurological: Pt is alert. Not confused  Skin: Skin is warm. No erythema. tan 1 cm non raised lesion right face Right ankle without tender, swelling, FROM, no effusion Psychiatric: Pt behavior is normal. Thought content normal.     Assessment & Plan:

## 2014-02-24 ENCOUNTER — Other Ambulatory Visit: Payer: Self-pay | Admitting: Internal Medicine

## 2014-02-27 NOTE — Telephone Encounter (Signed)
Done hardcopy to robin  

## 2014-02-27 NOTE — Telephone Encounter (Signed)
Faxed hardcopy to [facility] [address] 

## 2014-03-14 ENCOUNTER — Other Ambulatory Visit: Payer: Self-pay | Admitting: *Deleted

## 2014-03-14 NOTE — Telephone Encounter (Signed)
Received fax pt needing PA on his omeprazole. Completed PA on cover-my-meds.waiting on approval status...Raechel Chute/lmb

## 2014-03-21 NOTE — Telephone Encounter (Signed)
Received PA back med was not approved. Case ID [id number]...Raechel Chute/lmb

## 2014-04-13 ENCOUNTER — Other Ambulatory Visit: Payer: Self-pay | Admitting: Internal Medicine

## 2014-04-16 ENCOUNTER — Other Ambulatory Visit: Payer: Self-pay | Admitting: Internal Medicine

## 2014-05-21 ENCOUNTER — Telehealth: Payer: Self-pay | Admitting: *Deleted

## 2014-05-21 DIAGNOSIS — E119 Type 2 diabetes mellitus without complications: Secondary | ICD-10-CM

## 2014-05-21 NOTE — Telephone Encounter (Signed)
Patient coming in for an appointment. A1c, bmet, lipid ordered Diabetes bundle

## 2014-05-24 ENCOUNTER — Other Ambulatory Visit: Payer: Self-pay | Admitting: Internal Medicine

## 2014-05-25 ENCOUNTER — Other Ambulatory Visit: Payer: Self-pay | Admitting: Internal Medicine

## 2014-08-01 ENCOUNTER — Other Ambulatory Visit: Payer: Self-pay | Admitting: Internal Medicine

## 2014-08-17 ENCOUNTER — Telehealth: Payer: Self-pay | Admitting: Internal Medicine

## 2014-08-17 MED ORDER — METFORMIN HCL ER 500 MG PO TB24: 500.0000 mg | ORAL_TABLET | Freq: Every day | ORAL | Status: DC

## 2014-08-17 NOTE — Telephone Encounter (Signed)
[facility] [place]

## 2014-08-17 NOTE — Telephone Encounter (Signed)
Patient is requesting script for metformin

## 2014-10-04 ENCOUNTER — Telehealth: Payer: Self-pay

## 2014-10-04 ENCOUNTER — Other Ambulatory Visit (INDEPENDENT_AMBULATORY_CARE_PROVIDER_SITE_OTHER): Payer: BC Managed Care – PPO

## 2014-10-04 ENCOUNTER — Ambulatory Visit (INDEPENDENT_AMBULATORY_CARE_PROVIDER_SITE_OTHER): Payer: BC Managed Care – PPO | Admitting: Internal Medicine

## 2014-10-04 ENCOUNTER — Encounter: Payer: Self-pay | Admitting: Internal Medicine

## 2014-10-04 VITALS — BP 118/70 | HR 79 | Temp 98.5°F | Ht 70.0 in | Wt 256.1 lb

## 2014-10-04 DIAGNOSIS — E119 Type 2 diabetes mellitus without complications: Secondary | ICD-10-CM

## 2014-10-04 DIAGNOSIS — N400 Enlarged prostate without lower urinary tract symptoms: Secondary | ICD-10-CM

## 2014-10-04 DIAGNOSIS — Z23 Encounter for immunization: Secondary | ICD-10-CM

## 2014-10-04 DIAGNOSIS — Z0000 Encounter for general adult medical examination without abnormal findings: Secondary | ICD-10-CM

## 2014-10-04 DIAGNOSIS — B351 Tinea unguium: Secondary | ICD-10-CM

## 2014-10-04 DIAGNOSIS — R972 Elevated prostate specific antigen [PSA]: Secondary | ICD-10-CM

## 2014-10-04 LAB — CBC WITH DIFFERENTIAL/PLATELET
BASOS ABS: 0 10*3/uL (ref 0.0–0.1)
Basophils Relative: 0.5 % (ref 0.0–3.0)
Eosinophils Absolute: 0.1 10*3/uL (ref 0.0–0.7)
Eosinophils Relative: 1 % (ref 0.0–5.0)
HEMATOCRIT: 43.8 % (ref 39.0–52.0)
Hemoglobin: 14.7 g/dL (ref 13.0–17.0)
Lymphocytes Relative: 40.5 % (ref 12.0–46.0)
Lymphs Abs: 2.9 10*3/uL (ref 0.7–4.0)
MCHC: 33.5 g/dL (ref 30.0–36.0)
MCV: 89 fl (ref 78.0–100.0)
MONOS PCT: 5 % (ref 3.0–12.0)
Monocytes Absolute: 0.4 10*3/uL (ref 0.1–1.0)
Neutro Abs: 3.8 10*3/uL (ref 1.4–7.7)
Neutrophils Relative %: 53 % (ref 43.0–77.0)
PLATELETS: 249 10*3/uL (ref 150.0–400.0)
RBC: 4.92 Mil/uL (ref 4.22–5.81)
RDW: 13.3 % (ref 11.5–15.5)
WBC: 7.1 10*3/uL (ref 4.0–10.5)

## 2014-10-04 LAB — URINALYSIS, ROUTINE W REFLEX MICROSCOPIC
BILIRUBIN URINE: NEGATIVE
Hgb urine dipstick: NEGATIVE
Ketones, ur: NEGATIVE
LEUKOCYTES UA: NEGATIVE
Nitrite: NEGATIVE
RBC / HPF: NONE SEEN (ref 0–?)
SPECIFIC GRAVITY, URINE: 1.025 (ref 1.000–1.030)
Total Protein, Urine: NEGATIVE
Urine Glucose: NEGATIVE
Urobilinogen, UA: 0.2 (ref 0.0–1.0)
pH: 5.5 (ref 5.0–8.0)

## 2014-10-04 LAB — PSA: PSA: 4.89 ng/mL — ABNORMAL HIGH (ref 0.10–4.00)

## 2014-10-04 LAB — MICROALBUMIN / CREATININE URINE RATIO
Creatinine,U: 201.4 mg/dL
MICROALB/CREAT RATIO: 1.9 mg/g (ref 0.0–30.0)
Microalb, Ur: 3.8 mg/dL — ABNORMAL HIGH (ref 0.0–1.9)

## 2014-10-04 LAB — TSH: TSH: 1.23 u[IU]/mL (ref 0.35–4.50)

## 2014-10-04 LAB — HEMOGLOBIN A1C: Hgb A1c MFr Bld: 6.1 % (ref 4.6–6.5)

## 2014-10-04 MED ORDER — EFINACONAZOLE 10 % EX SOLN: CUTANEOUS | Status: DC

## 2014-10-04 MED ORDER — TADALAFIL 5 MG PO TABS: ORAL_TABLET | ORAL | Status: DC

## 2014-10-04 NOTE — Assessment & Plan Note (Signed)
stable overall by history and exam, recent data reviewed with pt, and pt to continue medical treatment as before,  to f/u any worsening symptoms or concerns / Lab Results  Component Value Date   HGBA1C 6.5 09/19/2013

## 2014-10-04 NOTE — Telephone Encounter (Signed)
Received denial for Jublia advise on alternative.

## 2014-10-04 NOTE — Telephone Encounter (Signed)
Please ask pt to see if [place] covered, but if not, I dont really have any other alternative that is topical

## 2014-10-04 NOTE — Assessment & Plan Note (Signed)
Will need consider f/u with urology, PSA has been stable but persistent elev, likely related to BPH, but will need urology for any worsening

## 2014-10-04 NOTE — Assessment & Plan Note (Signed)
Ok for cialis 5 mg daily if ok with his insurance

## 2014-10-04 NOTE — Assessment & Plan Note (Signed)
Ok for [place]jublia if ok with insurance

## 2014-10-04 NOTE — Assessment & Plan Note (Signed)

## 2014-10-04 NOTE — Patient Instructions (Addendum)
You had the flu shot today  Please take all new medication as prescribed  - the jublia, and the cialis 5 mg  Please continue all other medications as before, and refills have been done if requested.  Please have the pharmacy call with any other refills you may need.  Please continue your efforts at being more active, low cholesterol diet, and weight control.  You are otherwise up to date with prevention measures today.  Please keep your appointments with your specialists as you may have planned  Please go to the LAB in the Basement (turn left off the elevator) for the tests to be done today  You will be contacted by phone if any changes need to be made immediately.  Otherwise, you will receive a letter about your results with an explanation, but please check with MyChart first.  Please remember to sign up for MyChart if you have not done so, as this will be important to you in the future with finding out test results, communicating by private email, and scheduling acute appointments online when needed.  Please return in 6 months, or sooner if needed, with Lab testing done 3-5 days before

## 2014-10-04 NOTE — Progress Notes (Signed)
Subjective:    Patient ID: [name] PattenManuel A [name], male    DOB: 13-Sep-1951, 63 y.o.   MRN: [id number]  HPI  Here for wellness and f/u;  Overall doing ok;  Pt denies CP, worsening SOB, DOE, wheezing, orthopnea, PND, worsening LE edema, palpitations, dizziness or syncope.  Pt denies neurological change such as new headache, facial or extremity weakness.  Pt denies polydipsia, polyuria, or low sugar symptoms. Pt states overall good compliance with treatment and medications, good tolerability, and has been trying to follow lower cholesterol diet.  Pt denies worsening depressive symptoms, suicidal ideation or panic. No fever, night sweats, wt loss, loss of appetite, or other constitutional symptoms.  Pt states good ability with ADL's, has low fall risk, home safety reviewed and adequate, no other significant changes in hearing or vision, and only occasionally active with exercise.  Pt continues to have very mild recurring LBP with occas minor sciatica, no bowel or bladder change, fever, wt loss,  worsening LE pain/numbness/weakness, gait change or falls.  Has lost 9 lbs with better diet since fe b 2015, plans to do more, has an elipitacl at his home  Asks for jublia for toenails, also cialis for BPH symptoms mildly worse, has seen Dr Peterson/urology several yrs ago who has now retired Past Medical History  Diagnosis Date  . BACK PAIN 05/28/2010  . BOILS, RECURRENT 11/23/2007  . COLONIC POLYPS, HX OF 11/23/2007  . Cough 01/02/2010  . DIABETES MELLITUS, TYPE II 06/01/2008  . Esophageal reflux 11/23/2007  . HEMORRHOID, EXTERNAL, THROMBOSED 09/11/2010  . HYPERLIPIDEMIA 11/23/2007  . HYPERSOMNIA 07/01/2009  . HYPERTENSION 11/23/2007  . HYPOGONADISM 07/02/2010  . LUMBAR RADICULOPATHY, RIGHT 09/17/2010  . Obesity, unspecified 11/23/2007  . OBSTRUCTIVE SLEEP APNEA 07/11/2009  . Other abnormal glucose 11/22/2007  . Impaired glucose tolerance 07/31/2011  . Allergic rhinitis, cause unspecified 03/24/2013   No past surgical history on  file.  reports that he has quit smoking. He does not have any smokeless tobacco history on file. He reports that he drinks alcohol. He reports that he does not use illicit drugs. family history includes Asthma in his sister; Heart disease in his father; Hyperlipidemia in his father; Hypertension in his father. No Known Allergies Current Outpatient Prescriptions on File Prior to Visit  Medication Sig Dispense Refill  . amLODipine-benazepril (LOTREL) 5-20 MG per capsule TAKE ONE CAPSULE BY MOUTH DAILY 90 capsule 3  . ANDROGEL 50 MG/5GM (1%) GEL PLACE 5 GM ONTO THE SKIN DAILY 450 g 1  . aspirin 81 MG tablet Take 81 mg by mouth daily.      Marland Kitchen. CIALIS 20 MG tablet TAKE 1 TABLET BY MOUTH DAILY AS NEEDED FOR ERECTILE DYSFUNCTION 5 tablet 1  . hydrocortisone-pramoxine (ANALPRAM-HC) 2.5-1 % rectal cream Place rectally 2 (two) times daily. 30 g 2  . metFORMIN (GLUCOPHAGE-XR) 500 MG 24 hr tablet Take 1 tablet (500 mg total) by mouth daily with breakfast. 90 tablet 3  . mupirocin ointment (BACTROBAN) 2 % Apply topically daily. 22 g 1  . nabumetone (RELAFEN) 750 MG tablet TAKE 1 TABLET BY MOUTH TWICE DAILY as needed for pain 60 tablet 5  . omeprazole (PRILOSEC) 20 MG capsule TAKE 1 CAPSULE BY MOUTH DAILY 90 capsule 3  . omeprazole (PRILOSEC) 20 MG capsule TAKE ONE CAPSULE BY MOUTH DAILY 90 capsule 2  . predniSONE (DELTASONE) 10 MG tablet 2 tabs by mouth per day for 7 days 14 tablet 0  . simvastatin (ZOCOR) 20 MG tablet TAKE 1 TABLET  BY MOUTH DAILY 90 tablet 3  . valACYclovir (VALTREX) 1000 MG tablet TAKE 1 TABLET BY MOUTH TWICE DAILY 20 tablet 0  . vardenafil (LEVITRA) 20 MG tablet Take 1 tablet (20 mg total) by mouth as needed for erectile dysfunction. 5 tablet 11   No current facility-administered medications on file prior to visit.   Review of Systems Constitutional: Negative for increased diaphoresis, other activity, appetite or other siginficant weight change  HENT: Negative for worsening hearing  loss, ear pain, facial swelling, mouth sores and neck stiffness.   Eyes: Negative for other worsening pain, redness or visual disturbance.  Respiratory: Negative for shortness of breath and wheezing.   Cardiovascular: Negative for chest pain and palpitations.  Gastrointestinal: Negative for diarrhea, blood in stool, abdominal distention or other pain Genitourinary: Negative for hematuria, flank pain or change in urine volume.  Musculoskeletal: Negative for myalgias or other joint complaints.  Skin: Negative for color change and wound.  Neurological: Negative for syncope and numbness. other than noted Hematological: Negative for adenopathy. or other swelling Psychiatric/Behavioral: Negative for hallucinations, self-injury, decreased concentration or other worsening agitation.      Objective:   Physical Exam BP 118/70 mmHg  Pulse 79  Temp(Src) 98.5 F (36.9 C) (Oral)  Ht 5\' 10"  (1.778 m)  Wt 256 lb 2 oz (116.178 kg)  BMI 36.75 kg/m2  SpO2 95% VS noted,  Constitutional: Pt is oriented to person, place, and time. Appears well-developed and well-nourished.  Head: Normocephalic and atraumatic.  Right Ear: External ear normal.  Left Ear: External ear normal.  Nose: Nose normal.  Mouth/Throat: Oropharynx is clear and moist.  Eyes: Conjunctivae and EOM are normal. Pupils are equal, round, and reactive to light.  Neck: Normal range of motion. Neck supple. No JVD present. No tracheal deviation present.  Cardiovascular: Normal rate, regular rhythm, normal heart sounds and intact distal pulses.   Pulmonary/Chest: Effort normal and breath sounds without rales or wheezing  Abdominal: Soft. Bowel sounds are normal. NT. No HSM  Musculoskeletal: Normal range of motion. Exhibits no edema.  Lymphadenopathy:  Has no cervical adenopathy.  Neurological: Pt is alert and oriented to person, place, and time. Pt has normal reflexes. No cranial nerve deficit. Motor grossly intact Skin: Skin is warm and  dry. No rash noted.  Psychiatric:  Has normal mood and affect. Behavior is normal.  Wt Readings from Last 3 Encounters:  10/04/14 256 lb 2 oz (116.178 kg)  12/26/13 265 lb 4 oz (120.317 kg)  09/29/13 260 lb 8 oz (118.162 kg)       Assessment & Plan:

## 2014-10-04 NOTE — Progress Notes (Signed)
Pre visit review using our clinic review tool, if applicable. No additional management support is needed unless otherwise documented below in the visit note. 

## 2014-10-05 LAB — HEPATIC FUNCTION PANEL
ALBUMIN: 4.9 g/dL (ref 3.5–5.2)
ALK PHOS: 36 U/L — AB (ref 39–117)
ALT: 39 U/L (ref 0–53)
AST: 34 U/L (ref 0–37)
Bilirubin, Direct: 0.1 mg/dL (ref 0.0–0.3)
TOTAL PROTEIN: 7.1 g/dL (ref 6.0–8.3)
Total Bilirubin: 0.9 mg/dL (ref 0.2–1.2)

## 2014-10-05 LAB — LIPID PANEL
Cholesterol: 182 mg/dL (ref 0–200)
HDL: 60.4 mg/dL (ref >39.00)
LDL Cholesterol: 103 mg/dL — ABNORMAL HIGH (ref 0–99)
NonHDL: 121.6
TRIGLYCERIDES: 92 mg/dL (ref 0.0–149.0)
Total CHOL/HDL Ratio: 3
VLDL: 18.4 mg/dL (ref 0.0–40.0)

## 2014-10-05 LAB — BASIC METABOLIC PANEL
BUN: 17 mg/dL (ref 6–23)
CO2: 27 meq/L (ref 19–32)
Calcium: 10 mg/dL (ref 8.4–10.5)
Chloride: 103 mEq/L (ref 96–112)
Creatinine, Ser: 1.1 mg/dL (ref 0.4–1.5)
GFR: 73.05 mL/min (ref >60.00)
Glucose, Bld: 82 mg/dL (ref 70–99)
POTASSIUM: 4.4 meq/L (ref 3.5–5.1)
SODIUM: 139 meq/L (ref 135–145)

## 2014-10-05 NOTE — Telephone Encounter (Signed)
Called the patient left a detailed message on his cell and home number of PCP request and to call back to let us know.

## 2014-11-14 ENCOUNTER — Telehealth: Payer: Self-pay | Admitting: Internal Medicine

## 2014-11-14 NOTE — Telephone Encounter (Signed)
Since this a new problem, and last seen nov 19, pt to consider taking delsym otc prn, and consider OV if still perstent or worsening, or sooner with any increased productivity, fever, pain, sob or wheezing or swelling

## 2014-11-14 NOTE — Telephone Encounter (Signed)
Pt asking about getting a cough syrup for nagging cough that will not go away or advise.

## 2014-11-14 NOTE — Telephone Encounter (Signed)
Informed the patient of MD instructions.  The patient stated he has already tried several OTC meds, and has been seen at an urgent care where he was prescribed something, states due to the holidays coming he will go back to the urgent care previously seen at.

## 2014-11-29 ENCOUNTER — Ambulatory Visit (INDEPENDENT_AMBULATORY_CARE_PROVIDER_SITE_OTHER): Payer: BC Managed Care – PPO | Admitting: Internal Medicine

## 2014-11-29 ENCOUNTER — Encounter: Payer: Self-pay | Admitting: Internal Medicine

## 2014-11-29 VITALS — BP 122/80 | HR 110 | Temp 98.8°F | Ht 69.0 in | Wt 249.5 lb

## 2014-11-29 DIAGNOSIS — I10 Essential (primary) hypertension: Secondary | ICD-10-CM

## 2014-11-29 DIAGNOSIS — E119 Type 2 diabetes mellitus without complications: Secondary | ICD-10-CM

## 2014-11-29 DIAGNOSIS — J069 Acute upper respiratory infection, unspecified: Secondary | ICD-10-CM

## 2014-11-29 MED ORDER — LEVOFLOXACIN 250 MG PO TABS: 250.0000 mg | ORAL_TABLET | Freq: Every day | ORAL | Status: DC

## 2014-11-29 NOTE — Progress Notes (Signed)
Subjective:    Patient ID: [name] PattenManuel A Cohenour, male    DOB: Mar 17, 1951, 64 y.o.   MRN: [id number]  HPI   Here with 2-3 days acute onset fever, facial pain, pressure, headache, general weakness and malaise, and greenish d/c, with mild ST and cough, but pt denies chest pain, wheezing, increased sob or doe, orthopnea, PND, increased LE swelling, palpitations, dizziness or syncope. Leaving for short trip to [place] soon. [facility], but now worse.  Pt denies polydipsia, polyuria.     Past Medical History  Diagnosis Date  . BACK PAIN 05/28/2010  . BOILS, RECURRENT 11/23/2007  . COLONIC POLYPS, HX OF 11/23/2007  . Cough 01/02/2010  . DIABETES MELLITUS, TYPE II 06/01/2008  . Esophageal reflux 11/23/2007  . HEMORRHOID, EXTERNAL, THROMBOSED 09/11/2010  . HYPERLIPIDEMIA 11/23/2007  . HYPERSOMNIA 07/01/2009  . HYPERTENSION 11/23/2007  . HYPOGONADISM 07/02/2010  . LUMBAR RADICULOPATHY, RIGHT 09/17/2010  . Obesity, unspecified 11/23/2007  . OBSTRUCTIVE SLEEP APNEA 07/11/2009  . Other abnormal glucose 11/22/2007  . Impaired glucose tolerance 07/31/2011  . Allergic rhinitis, cause unspecified 03/24/2013   No past surgical history on file.  reports that he has quit smoking. He does not have any smokeless tobacco history on file. He reports that he drinks alcohol. He reports that he does not use illicit drugs. family history includes Asthma in his sister; Heart disease in his father; Hyperlipidemia in his father; Hypertension in his father. No Known Allergies Current Outpatient Prescriptions on File Prior to Visit  Medication Sig Dispense Refill  . amLODipine-benazepril (LOTREL) 5-20 MG per capsule TAKE ONE CAPSULE BY MOUTH DAILY 90 capsule 3  . ANDROGEL 50 MG/5GM (1%) GEL PLACE 5 GM ONTO THE SKIN DAILY 450 g 1  . aspirin 81 MG tablet Take 81 mg by mouth daily.      . Efinaconazole (JUBLIA) 10 % SOLN Use as directed daily as needed 8 mL 11  . hydrocortisone-pramoxine (ANALPRAM-HC) 2.5-1 %  rectal cream Place rectally 2 (two) times daily. 30 g 2  . metFORMIN (GLUCOPHAGE-XR) 500 MG 24 hr tablet Take 1 tablet (500 mg total) by mouth daily with breakfast. 90 tablet 3  . mupirocin ointment (BACTROBAN) 2 % Apply topically daily. 22 g 1  . nabumetone (RELAFEN) 750 MG tablet TAKE 1 TABLET BY MOUTH TWICE DAILY as needed for pain 60 tablet 5  . omeprazole (PRILOSEC) 20 MG capsule TAKE 1 CAPSULE BY MOUTH DAILY 90 capsule 3  . predniSONE (DELTASONE) 10 MG tablet 2 tabs by mouth per day for 7 days 14 tablet 0  . simvastatin (ZOCOR) 20 MG tablet TAKE 1 TABLET BY MOUTH DAILY 90 tablet 3  . tadalafil (CIALIS) 5 MG tablet 1 by mouth per day 90 tablet 3  . valACYclovir (VALTREX) 1000 MG tablet TAKE 1 TABLET BY MOUTH TWICE DAILY 20 tablet 0   No current facility-administered medications on file prior to visit.   Review of Systems  Constitutional: Negative for unusual diaphoresis or other sweats  HENT: Negative for ringing in ear Eyes: Negative for double vision or worsening visual disturbance.  Respiratory: Negative for choking and stridor.   Gastrointestinal: Negative for vomiting or other signifcant bowel change Genitourinary: Negative for hematuria or decreased urine volume.  Musculoskeletal: Negative for other MSK pain or swelling Skin: Negative for color change and worsening wound.  Neurological: Negative for tremors and numbness other than noted  Psychiatric/Behavioral: Negative for decreased concentration or agitation other than above  Objective:   Physical Exam BP 122/80 mmHg  Pulse 110  Temp(Src) 98.8 F (37.1 C) (Oral)  Ht  (1.753 m)  Wt 249 lb 8 oz (113.172 kg)  BMI 36.83 kg/m2  SpO2 94% VS noted, mild ill Constitutional: Pt appears well-developed, well-nourished.  HENT: Head: NCAT.  Right Ear: External ear normal.  Left Ear: External ear normal.  Eyes: . Pupils are equal, round, and reactive to light. Conjunctivae and EOM are normal Bilat tm's with mild  erythema.  Max sinus areas mild tender.  Pharynx with mild erythema, no exudate Neck: Normal range of motion. Neck supple.  Cardiovascular: Normal rate and regular rhythm.   Pulmonary/Chest: Effort normal and breath sounds without rales or wheezing.  Neurological: Pt is alert. Not confused , motor grossly intact Skin: Skin is warm. No rash Psychiatric: Pt behavior is normal. No agitation.     Assessment & Plan:

## 2014-11-29 NOTE — Patient Instructions (Signed)
Please take all new medication as prescribed - the antibiotic  You can also take Delsym OTC for cough, and/or Mucinex (or it's generic off brand) for congestion, and tylenol as needed for pain.  Please continue all other medications as before, and refills have been done if requested.  Please have the pharmacy call with any other refills you may need.  Please keep your appointments with your specialists as you may have planned  Good luck on your Trip!

## 2014-12-02 NOTE — Assessment & Plan Note (Signed)
stable overall by history and exam, recent data reviewed with pt, and pt to continue medical treatment as before,  to f/u any worsening symptoms or concerns BP Readings from Last 3 Encounters:  11/29/14 122/80  10/04/14 118/70  12/26/13 140/78

## 2014-12-02 NOTE — Assessment & Plan Note (Signed)
Mild to mod, for antibx course,  to f/u any worsening symptoms or concerns 

## 2014-12-02 NOTE — Assessment & Plan Note (Signed)
stable overall by history and exam, recent data reviewed with pt, and pt to continue medical treatment as before,  to f/u any worsening symptoms or concerns Lab Results  Component Value Date   HGBA1C 6.1 10/04/2014    

## 2015-01-02 ENCOUNTER — Other Ambulatory Visit: Payer: Self-pay | Admitting: Internal Medicine

## 2015-01-03 NOTE — Telephone Encounter (Signed)
Done hardcopy to [name] [name]  

## 2015-01-03 NOTE — Telephone Encounter (Signed)
Faxed script back to [facility].../lmb 

## 2015-04-05 ENCOUNTER — Ambulatory Visit (INDEPENDENT_AMBULATORY_CARE_PROVIDER_SITE_OTHER): Payer: BC Managed Care – PPO | Admitting: Internal Medicine

## 2015-04-05 ENCOUNTER — Encounter: Payer: Self-pay | Admitting: Internal Medicine

## 2015-04-05 VITALS — BP 142/88 | HR 72 | Temp 97.9°F | Wt 259.0 lb

## 2015-04-05 DIAGNOSIS — N400 Enlarged prostate without lower urinary tract symptoms: Secondary | ICD-10-CM

## 2015-04-05 DIAGNOSIS — E119 Type 2 diabetes mellitus without complications: Secondary | ICD-10-CM

## 2015-04-05 DIAGNOSIS — R972 Elevated prostate specific antigen [PSA]: Principal | ICD-10-CM

## 2015-04-05 DIAGNOSIS — E785 Hyperlipidemia, unspecified: Secondary | ICD-10-CM | POA: Diagnosis not present

## 2015-04-05 DIAGNOSIS — I10 Essential (primary) hypertension: Secondary | ICD-10-CM

## 2015-04-05 DIAGNOSIS — Z0000 Encounter for general adult medical examination without abnormal findings: Secondary | ICD-10-CM

## 2015-04-05 DIAGNOSIS — Z0189 Encounter for other specified special examinations: Secondary | ICD-10-CM

## 2015-04-05 MED ORDER — HYDROCORTISONE ACE-PRAMOXINE 2.5-1 % RE CREA
TOPICAL_CREAM | Freq: Two times a day (BID) | RECTAL | Status: DC
Start: 2015-04-05 — End: 2015-10-03

## 2015-04-05 MED ORDER — TAMSULOSIN HCL 0.4 MG PO CAPS: 0.4000 mg | ORAL_CAPSULE | Freq: Every day | ORAL | Status: DC

## 2015-04-05 MED ORDER — VALACYCLOVIR HCL 1 G PO TABS: 1000.0000 mg | ORAL_TABLET | Freq: Two times a day (BID) | ORAL | Status: DC

## 2015-04-05 MED ORDER — MUPIROCIN 2 % EX OINT: TOPICAL_OINTMENT | Freq: Every day | CUTANEOUS | Status: DC

## 2015-04-05 MED ORDER — OMEPRAZOLE 20 MG PO CPDR: 20.0000 mg | DELAYED_RELEASE_CAPSULE | Freq: Every day | ORAL | Status: DC

## 2015-04-05 NOTE — Progress Notes (Signed)
Subjective:    Patient ID: Orlean PattenManuel A Baka, male    DOB: Sep 29, 1951, 64 y.o.   MRN: [id number]  HPI  Here to f/u; overall doing ok,  Pt denies chest pain, increasing sob or doe, wheezing, orthopnea, PND, increased LE swelling, palpitations, dizziness or syncope.  Pt denies new neurological symptoms such as new headache, or facial or extremity weakness or numbness.  Pt denies polydipsia, polyuria, or low sugar episode.   Pt denies new neurological symptoms such as new headache, or facial or extremity weakness or numbness.   Pt states overall good compliance with meds, mostly trying to follow appropriate diet, with wt overall stable,  but little exercise however.  Had cialis 5 mg OTC while in [place] recently and worked very well for BPH symptoms, but cialis not covered by insurance. Andorgel increased use aggrevated the symptoms.  Has not tried flomax.   Past Medical History  Diagnosis Date  . BACK PAIN 05/28/2010  . BOILS, RECURRENT 11/23/2007  . COLONIC POLYPS, HX OF 11/23/2007  . Cough 01/02/2010  . DIABETES MELLITUS, TYPE II 06/01/2008  . Esophageal reflux 11/23/2007  . HEMORRHOID, EXTERNAL, THROMBOSED 09/11/2010  . HYPERLIPIDEMIA 11/23/2007  . HYPERSOMNIA 07/01/2009  . HYPERTENSION 11/23/2007  . HYPOGONADISM 07/02/2010  . LUMBAR RADICULOPATHY, RIGHT 09/17/2010  . Obesity, unspecified 11/23/2007  . OBSTRUCTIVE SLEEP APNEA 07/11/2009  . Other abnormal glucose 11/22/2007  . Impaired glucose tolerance 07/31/2011  . Allergic rhinitis, cause unspecified 03/24/2013   No past surgical history on file.  reports that he has quit smoking. He does not have any smokeless tobacco history on file. He reports that he drinks alcohol. He reports that he does not use illicit drugs. family history includes Asthma in his sister; Heart disease in his father; Hyperlipidemia in his father; Hypertension in his father. No Known Allergies Current Outpatient Prescriptions on File Prior to Visit  Medication Sig Dispense Refill  .  amLODipine-benazepril (LOTREL) 5-20 MG per capsule TAKE ONE CAPSULE BY MOUTH DAILY 90 capsule 3  . aspirin 81 MG tablet Take 81 mg by mouth daily.      . Efinaconazole (JUBLIA) 10 % SOLN Use as directed daily as needed 8 mL 11  . metFORMIN (GLUCOPHAGE-XR) 500 MG 24 hr tablet Take 1 tablet (500 mg total) by mouth daily with breakfast. 90 tablet 3  . mupirocin ointment (BACTROBAN) 2 % Apply topically daily. 22 g 1  . nabumetone (RELAFEN) 750 MG tablet TAKE 1 TABLET BY MOUTH TWICE DAILY as needed for pain 60 tablet 5  . simvastatin (ZOCOR) 20 MG tablet TAKE 1 TABLET BY MOUTH DAILY 90 tablet 3  . tadalafil (CIALIS) 5 MG tablet 1 by mouth per day 90 tablet 3  . testosterone (ANDROGEL) 50 MG/5GM (1%) GEL APPLY 5 GRAMS TO SKIN DAILY 450 g 1  . levofloxacin (LEVAQUIN) 250 MG tablet Take 1 tablet (250 mg total) by mouth daily. (Patient not taking: Reported on 04/05/2015) 10 tablet 0  . predniSONE (DELTASONE) 10 MG tablet 2 tabs by mouth per day for 7 days (Patient not taking: Reported on 04/05/2015) 14 tablet 0   No current facility-administered medications on file prior to visit.   Review of Systems  Constitutional: Negative for unusual diaphoresis or night sweats HENT: Negative for ringing in ear or discharge Eyes: Negative for double vision or worsening visual disturbance.  Respiratory: Negative for choking and stridor.   Gastrointestinal: Negative for vomiting or other signifcant bowel change Genitourinary: Negative for hematuria or change in urine  volume.  Musculoskeletal: Negative for other MSK pain or swelling Skin: Negative for color change and worsening wound.  Neurological: Negative for tremors and numbness other than noted  Psychiatric/Behavioral: Negative for decreased concentration or agitation other than above       Objective:   Physical Exam BP 142/88 mmHg  Pulse 72  Temp(Src) 97.9 F (36.6 C) (Oral)  Wt 259 lb (117.482 kg)  SpO2 96% VS noted,  Constitutional: Pt appears in no  significant distress HENT: Head: NCAT.  Right Ear: External ear normal.  Left Ear: External ear normal.  Eyes: . Pupils are equal, round, and reactive to light. Conjunctivae and EOM are normal Neck: Normal range of motion. Neck supple.  Cardiovascular: Normal rate and regular rhythm.   Pulmonary/Chest: Effort normal and breath sounds without rales or wheezing.  Abd:  Soft, NT, ND, + BS Neurological: Pt is alert. Not confused , motor grossly intact Skin: Skin is warm. No rash, no LE edema Psychiatric: Pt behavior is normal. No agitation.         Assessment & Plan:

## 2015-04-05 NOTE — Assessment & Plan Note (Signed)
stable overall by history and exam, recent data reviewed with pt, and pt to continue medical treatment as before,  to f/u any worsening symptoms or concerns Lab Results  Component Value Date   LDLCALC 103* 10/04/2014   To cont low chol diet, f/u lab next visit

## 2015-04-05 NOTE — Assessment & Plan Note (Signed)
stable overall by history and exam, recent data reviewed with pt, and pt to continue medical treatment as before,  to f/u any worsening symptoms or concerns Lab Results  Component Value Date   HGBA1C 6.1 10/04/2014   For f/u a1c next visit

## 2015-04-05 NOTE — Assessment & Plan Note (Signed)
/.  stable overall by history and exam, recent data reviewed with pt, and pt to continue medical treatment as before,  to f/u any worsening symptoms or concerns BP Readings from Last 3 Encounters:  04/05/15 142/88  11/29/14 122/80  10/04/14 118/70   For f/u BP next visit

## 2015-04-05 NOTE — Progress Notes (Signed)
Pre visit review using our clinic review tool, if applicable. No additional management support is needed unless otherwise documented below in the visit note. 

## 2015-04-05 NOTE — Patient Instructions (Signed)
Please take all new medication as prescribed - the flomax for prostatism  Please continue all other medications as before, and refills have been done if requested.  Please have the pharmacy call with any other refills you may need.  Please continue your efforts at being more active, low cholesterol diet, and weight control.  Please keep your appointments with your specialists as you may have planned  Please return in 6 months, or sooner if needed, with Lab testing done 3-5 days before

## 2015-04-25 ENCOUNTER — Other Ambulatory Visit: Payer: Self-pay | Admitting: Internal Medicine

## 2015-06-18 ENCOUNTER — Other Ambulatory Visit: Payer: Self-pay | Admitting: Internal Medicine

## 2015-08-06 ENCOUNTER — Other Ambulatory Visit: Payer: Self-pay | Admitting: Internal Medicine

## 2015-08-08 ENCOUNTER — Other Ambulatory Visit: Payer: Self-pay

## 2015-08-08 MED ORDER — METFORMIN HCL ER 500 MG PO TB24: 500.0000 mg | ORAL_TABLET | Freq: Every day | ORAL | Status: DC

## 2015-08-23 ENCOUNTER — Other Ambulatory Visit: Payer: Self-pay | Admitting: Internal Medicine

## 2015-08-26 MED ORDER — TESTOSTERONE 50 MG/5GM (1%) TD GEL: TRANSDERMAL | Status: DC

## 2015-08-26 NOTE — Addendum Note (Signed)
Addended by: Anselm Jungling on: 08/26/2015 08:40 AM   Modules accepted: Orders

## 2015-08-26 NOTE — Telephone Encounter (Signed)
Rx faxed to pharmacy  

## 2015-09-06 ENCOUNTER — Telehealth: Payer: Self-pay | Admitting: Internal Medicine

## 2015-09-06 MED ORDER — TESTOSTERONE 50 MG/5GM (1%) TD GEL: TRANSDERMAL | Status: DC

## 2015-09-06 NOTE — Telephone Encounter (Signed)
rx done, ok for preauth if needed, o/w to f/u next visit

## 2015-09-06 NOTE — Telephone Encounter (Signed)
Fax script back to [facility]...Raechel Chute/lmb

## 2015-09-06 NOTE — Telephone Encounter (Signed)
Pt called stated [facility] need an authorization from Dr. Jonny RuizJohn for testosterone (ANDROGEL) 50 MG/5GM. Please help

## 2015-09-06 NOTE — Telephone Encounter (Signed)
Please advise, thanks.

## 2015-09-17 ENCOUNTER — Telehealth: Payer: Self-pay | Admitting: Internal Medicine

## 2015-09-17 NOTE — Telephone Encounter (Signed)
Patient states that pharmacy either needs script or PA for testosterone.  Tried to contact pharmacy but their automatic system was not working.

## 2015-09-19 ENCOUNTER — Telehealth: Payer: Self-pay

## 2015-09-19 NOTE — Telephone Encounter (Signed)
[id number]

## 2015-09-19 NOTE — Telephone Encounter (Signed)
PA approved. Pharmacy notified 

## 2015-09-26 NOTE — Telephone Encounter (Signed)
Pt called in and is request a update on the status of the PA of for the Testosterone.

## 2015-09-27 NOTE — Telephone Encounter (Signed)
Pt advised PA Approved via personal VM (see phone note regarding PA)

## 2015-10-01 ENCOUNTER — Telehealth: Payer: Self-pay

## 2015-10-01 NOTE — Telephone Encounter (Signed)
Error

## 2015-10-03 ENCOUNTER — Encounter: Payer: Self-pay | Admitting: Internal Medicine

## 2015-10-03 ENCOUNTER — Ambulatory Visit (INDEPENDENT_AMBULATORY_CARE_PROVIDER_SITE_OTHER): Payer: BC Managed Care – PPO | Admitting: Internal Medicine

## 2015-10-03 ENCOUNTER — Other Ambulatory Visit (INDEPENDENT_AMBULATORY_CARE_PROVIDER_SITE_OTHER): Payer: BC Managed Care – PPO

## 2015-10-03 VITALS — BP 142/86 | HR 78 | Temp 98.0°F | Ht 69.0 in | Wt 262.0 lb

## 2015-10-03 DIAGNOSIS — Z0000 Encounter for general adult medical examination without abnormal findings: Secondary | ICD-10-CM

## 2015-10-03 DIAGNOSIS — Z23 Encounter for immunization: Secondary | ICD-10-CM | POA: Diagnosis not present

## 2015-10-03 DIAGNOSIS — E119 Type 2 diabetes mellitus without complications: Secondary | ICD-10-CM | POA: Diagnosis not present

## 2015-10-03 DIAGNOSIS — E291 Testicular hypofunction: Secondary | ICD-10-CM

## 2015-10-03 LAB — CBC WITH DIFFERENTIAL/PLATELET
Basophils Absolute: 0 10*3/uL (ref 0.0–0.1)
Basophils Relative: 0.4 % (ref 0.0–3.0)
EOS PCT: 1.4 % (ref 0.0–5.0)
Eosinophils Absolute: 0.1 10*3/uL (ref 0.0–0.7)
HCT: 45.9 % (ref 39.0–52.0)
Hemoglobin: 15.7 g/dL (ref 13.0–17.0)
LYMPHS ABS: 2.5 10*3/uL (ref 0.7–4.0)
Lymphocytes Relative: 38.2 % (ref 12.0–46.0)
MCHC: 34.2 g/dL (ref 30.0–36.0)
MCV: 87.9 fl (ref 78.0–100.0)
MONO ABS: 0.4 10*3/uL (ref 0.1–1.0)
Monocytes Relative: 6.8 % (ref 3.0–12.0)
NEUTROS PCT: 53.2 % (ref 43.0–77.0)
Neutro Abs: 3.4 10*3/uL (ref 1.4–7.7)
PLATELETS: 273 10*3/uL (ref 150.0–400.0)
RBC: 5.22 Mil/uL (ref 4.22–5.81)
RDW: 14.2 % (ref 11.5–15.5)
WBC: 6.4 10*3/uL (ref 4.0–10.5)

## 2015-10-03 LAB — HEPATITIS C ANTIBODY: HCV Ab: NEGATIVE

## 2015-10-03 LAB — URINALYSIS, ROUTINE W REFLEX MICROSCOPIC
Bilirubin Urine: NEGATIVE
Hgb urine dipstick: NEGATIVE
KETONES UR: NEGATIVE
Leukocytes, UA: NEGATIVE
NITRITE: NEGATIVE
PH: 6 (ref 5.0–8.0)
RBC / HPF: NONE SEEN (ref 0–?)
SPECIFIC GRAVITY, URINE: 1.02 (ref 1.000–1.030)
TOTAL PROTEIN, URINE-UPE24: NEGATIVE
URINE GLUCOSE: NEGATIVE
Urobilinogen, UA: 0.2 (ref 0.0–1.0)

## 2015-10-03 LAB — MICROALBUMIN / CREATININE URINE RATIO
CREATININE, U: 133.5 mg/dL
MICROALB/CREAT RATIO: 2.7 mg/g (ref 0.0–30.0)
Microalb, Ur: 3.6 mg/dL — ABNORMAL HIGH (ref 0.0–1.9)

## 2015-10-03 LAB — BASIC METABOLIC PANEL
BUN: 17 mg/dL (ref 6–23)
CHLORIDE: 101 meq/L (ref 96–112)
CO2: 29 mEq/L (ref 19–32)
Calcium: 10.4 mg/dL (ref 8.4–10.5)
Creatinine, Ser: 1.15 mg/dL (ref 0.40–1.50)
GFR: 68.45 mL/min (ref >60.00)
GLUCOSE: 102 mg/dL — AB (ref 70–99)
POTASSIUM: 4.4 meq/L (ref 3.5–5.1)
Sodium: 139 mEq/L (ref 135–145)

## 2015-10-03 LAB — HEPATIC FUNCTION PANEL
ALT: 46 U/L (ref 0–53)
AST: 40 U/L — AB (ref 0–37)
Albumin: 5.1 g/dL (ref 3.5–5.2)
Alkaline Phosphatase: 40 U/L (ref 39–117)
BILIRUBIN TOTAL: 0.8 mg/dL (ref 0.2–1.2)
Bilirubin, Direct: 0.1 mg/dL (ref 0.0–0.3)
Total Protein: 7.7 g/dL (ref 6.0–8.3)

## 2015-10-03 LAB — LIPID PANEL
CHOL/HDL RATIO: 3
CHOLESTEROL: 181 mg/dL (ref 0–200)
HDL: 59.7 mg/dL (ref >39.00)
LDL Cholesterol: 101 mg/dL — ABNORMAL HIGH (ref 0–99)
NonHDL: 121.53
TRIGLYCERIDES: 101 mg/dL (ref 0.0–149.0)
VLDL: 20.2 mg/dL (ref 0.0–40.0)

## 2015-10-03 LAB — PSA: PSA: 4.68 ng/mL — ABNORMAL HIGH (ref 0.10–4.00)

## 2015-10-03 LAB — HEMOGLOBIN A1C: Hgb A1c MFr Bld: 6.4 % (ref 4.6–6.5)

## 2015-10-03 LAB — TSH: TSH: 1.73 u[IU]/mL (ref 0.35–4.50)

## 2015-10-03 LAB — TESTOSTERONE: Testosterone: 195.85 ng/dL — ABNORMAL LOW (ref 300.00–890.00)

## 2015-10-03 MED ORDER — TADALAFIL 5 MG PO TABS: ORAL_TABLET | ORAL | Status: DC

## 2015-10-03 MED ORDER — HYDROCORTISONE ACE-PRAMOXINE 2.5-1 % RE CREA: TOPICAL_CREAM | Freq: Two times a day (BID) | RECTAL | Status: DC

## 2015-10-03 MED ORDER — VALACYCLOVIR HCL 1 G PO TABS: 1000.0000 mg | ORAL_TABLET | Freq: Two times a day (BID) | ORAL | Status: DC

## 2015-10-03 MED ORDER — MUPIROCIN 2 % EX OINT: TOPICAL_OINTMENT | Freq: Every day | CUTANEOUS | Status: DC

## 2015-10-03 NOTE — Assessment & Plan Note (Signed)
stable overall by history and exam, recent data reviewed with pt, and pt to continue medical treatment as before,  to f/u any worsening symptoms or concerns Lab Results  Component Value Date   HGBA1C 6.1 10/04/2014

## 2015-10-03 NOTE — Addendum Note (Signed)
Addended by: Corwin LevinsJOHN, JAMES W on: 10/03/2015 11:40 AM   Modules accepted: Orders

## 2015-10-03 NOTE — Patient Instructions (Signed)

## 2015-10-03 NOTE — Progress Notes (Signed)
Subjective:    Patient ID: [name] [name], male    DOB: 25-Dec-1950, 64 y.o.   MRN: [id number]  HPI  Here for wellness and f/u;  Overall doing ok;  Pt denies Chest pain, worsening SOB, DOE, wheezing, orthopnea, PND, worsening LE edema, palpitations, dizziness or syncope.  Pt denies neurological change such as new headache, facial or extremity weakness.  Pt denies polydipsia, polyuria, or low sugar symptoms. Pt states overall good compliance with treatment and medications, good tolerability, and has been trying to follow appropriate diet.  Pt denies worsening depressive symptoms, suicidal ideation or panic. No fever, night sweats, wt loss, loss of appetite, or other constitutional symptoms.  Pt states good ability with ADL's, has low fall risk, home safety reviewed and adequate, no other significant changes in hearing or vision, and only occasionally active with exercise. Has gained 13 lbs recently due to lack of exercise, much less than before.  Needs testosterone refill. Wt Readings from Last 3 Encounters:  10/03/15 262 lb (118.842 kg)  04/05/15 259 lb (117.482 kg)  11/29/14 249 lb 8 oz (113.172 kg)   Past Medical History  Diagnosis Date  . BACK PAIN 05/28/2010  . BOILS, RECURRENT 11/23/2007  . COLONIC POLYPS, HX OF 11/23/2007  . Cough 01/02/2010  . DIABETES MELLITUS, TYPE II 06/01/2008  . Esophageal reflux 11/23/2007  . HEMORRHOID, EXTERNAL, THROMBOSED 09/11/2010  . HYPERLIPIDEMIA 11/23/2007  . HYPERSOMNIA 07/01/2009  . HYPERTENSION 11/23/2007  . HYPOGONADISM 07/02/2010  . LUMBAR RADICULOPATHY, RIGHT 09/17/2010  . Obesity, unspecified 11/23/2007  . OBSTRUCTIVE SLEEP APNEA 07/11/2009  . Other abnormal glucose 11/22/2007  . Impaired glucose tolerance 07/31/2011  . Allergic rhinitis, cause unspecified 03/24/2013   No past surgical history on file.  reports that he has quit smoking. He does not have any smokeless tobacco history on file. He reports that he drinks alcohol. He reports that he does not use illicit  drugs. family history includes Asthma in his sister; Heart disease in his father; Hyperlipidemia in his father; Hypertension in his father. No Known Allergies Current Outpatient Prescriptions on File Prior to Visit  Medication Sig Dispense Refill  . amLODipine-benazepril (LOTREL) 5-20 MG per capsule TAKE 1 CAPSULE BY MOUTH DAILY 90 capsule 1  . aspirin 81 MG tablet Take 81 mg by mouth daily.      . hydrocortisone-pramoxine (ANALPRAM-HC) 2.5-1 % rectal cream Place rectally 2 (two) times daily. 30 g 2  . metFORMIN (GLUCOPHAGE-XR) 500 MG 24 hr tablet Take 1 tablet (500 mg total) by mouth daily with breakfast. 90 tablet 3  . mupirocin ointment (BACTROBAN) 2 % Apply topically daily. 22 g 1  . nabumetone (RELAFEN) 750 MG tablet TAKE 1 TABLET BY MOUTH TWICE DAILY AS NEEDED FOR PAIN 60 tablet 0  . omeprazole (PRILOSEC) 20 MG capsule Take 1 capsule (20 mg total) by mouth daily. 90 capsule 3  . simvastatin (ZOCOR) 20 MG tablet TAKE 1 TABLET BY MOUTH EVERY DAY 90 tablet 1  . tadalafil (CIALIS) 5 MG tablet 1 by mouth per day 90 tablet 3  . tamsulosin (FLOMAX) 0.4 MG CAPS capsule Take 1 capsule (0.4 mg total) by mouth daily. 90 capsule 3  . testosterone (ANDROGEL) 50 MG/5GM (1%) GEL APPLY 5GM(ONE PACKET) TO SKIN DAILY 450 g 1  . Efinaconazole (JUBLIA) 10 % SOLN Use as directed daily as needed (Patient not taking: Reported on 10/03/2015) 8 mL 11  . levofloxacin (LEVAQUIN) 250 MG tablet Take 1 tablet (250 mg total) by mouth daily. (Patient  not taking: Reported on 04/05/2015) 10 tablet 0  . predniSONE (DELTASONE) 10 MG tablet 2 tabs by mouth per day for 7 days (Patient not taking: Reported on 04/05/2015) 14 tablet 0   No current facility-administered medications on file prior to visit.   Review of Systems Constitutional: Negative for increased diaphoresis, other activity, appetite or siginficant weight change other than noted HENT: Negative for worsening hearing loss, ear pain, facial swelling, mouth sores and  neck stiffness.   Eyes: Negative for other worsening pain, redness or visual disturbance.  Respiratory: Negative for shortness of breath and wheezing  Cardiovascular: Negative for chest pain and palpitations.  Gastrointestinal: Negative for diarrhea, blood in stool, abdominal distention or other pain Genitourinary: Negative for hematuria, flank pain or change in urine volume.  Musculoskeletal: Negative for myalgias or other joint complaints.  Skin: Negative for color change and wound or drainage.  Neurological: Negative for syncope and numbness. other than noted Hematological: Negative for adenopathy. or other swelling Psychiatric/Behavioral: Negative for hallucinations, SI, self-injury, decreased concentration or other worsening agitation.      Objective:   Physical Exam BP 142/86 mmHg  Pulse 78  Temp(Src) 98 F (36.7 C) (Oral)  Ht 5\' 9"  (1.753 m)  Wt 262 lb (118.842 kg)  BMI 38.67 kg/m2  SpO2 98% /VS noted,  Constitutional: Pt is oriented to person, place, and time. Appears well-developed and well-nourished, in no significant distress Head: Normocephalic and atraumatic.  Right Ear: External ear normal.  Left Ear: External ear normal.  Nose: Nose normal.  Mouth/Throat: Oropharynx is clear and moist.  Eyes: Conjunctivae and EOM are normal. Pupils are equal, round, and reactive to light.  Neck: Normal range of motion. Neck supple. No JVD present. No tracheal deviation present or significant neck LA or mass Cardiovascular: Normal rate, regular rhythm, normal heart sounds and intact distal pulses.   Pulmonary/Chest: Effort normal and breath sounds without rales or wheezing  Abdominal: Soft. Bowel sounds are normal. NT. No HSM  Musculoskeletal: Normal range of motion. Exhibits no edema.  Lymphadenopathy:  Has no cervical adenopathy.  Neurological: Pt is alert and oriented to person, place, and time. Pt has normal reflexes. No cranial nerve deficit. Motor grossly intact Skin: Skin is  warm and dry. No rash noted.  Psychiatric:  Has normal mood and affect. Behavior is normal.      Assessment & Plan:

## 2015-10-03 NOTE — Progress Notes (Signed)
Pre visit review using our clinic review tool, if applicable. No additional management support is needed unless otherwise documented below in the visit note. 

## 2015-10-03 NOTE — Assessment & Plan Note (Signed)

## 2015-12-26 ENCOUNTER — Other Ambulatory Visit: Payer: Self-pay | Admitting: Internal Medicine

## 2015-12-31 ENCOUNTER — Telehealth: Payer: Self-pay | Admitting: Internal Medicine

## 2015-12-31 MED ORDER — NABUMETONE 750 MG PO TABS: 750.0000 mg | ORAL_TABLET | Freq: Two times a day (BID) | ORAL | Status: DC | PRN

## 2015-12-31 MED ORDER — SIMVASTATIN 20 MG PO TABS: 20.0000 mg | ORAL_TABLET | Freq: Every day | ORAL | Status: DC

## 2015-12-31 NOTE — Telephone Encounter (Signed)
Patient was here in November, 2016 for wellness visit, not sure why you denied rx refill requests---corrine, please let tamara know what needs to happen

## 2015-12-31 NOTE — Telephone Encounter (Signed)
Patient states his next appointment is in May and is not coming in before for medication refills.  Does not understand why he needs to come in when he was just in in November.  Patient is requesting a call back in regards. Can you please follow up on the refills that were sent over electronically.

## 2015-12-31 NOTE — Telephone Encounter (Signed)
rx's sent to pt pharmacy.

## 2016-01-01 NOTE — Telephone Encounter (Signed)
Pt called back in regarding medication. I told him the prescriptions are at the pharmacy but he still wants a call back.  Can you please call him.

## 2016-03-16 ENCOUNTER — Other Ambulatory Visit: Payer: Self-pay | Admitting: Internal Medicine

## 2016-03-27 ENCOUNTER — Other Ambulatory Visit (INDEPENDENT_AMBULATORY_CARE_PROVIDER_SITE_OTHER): Payer: BC Managed Care – PPO

## 2016-03-27 DIAGNOSIS — E119 Type 2 diabetes mellitus without complications: Secondary | ICD-10-CM | POA: Diagnosis not present

## 2016-03-27 LAB — BASIC METABOLIC PANEL
BUN: 12 mg/dL (ref 6–23)
CALCIUM: 10.1 mg/dL (ref 8.4–10.5)
CHLORIDE: 102 meq/L (ref 96–112)
CO2: 29 meq/L (ref 19–32)
Creatinine, Ser: 1.12 mg/dL (ref 0.40–1.50)
GFR: 70.46 mL/min (ref >60.00)
GLUCOSE: 135 mg/dL — AB (ref 70–99)
POTASSIUM: 4.7 meq/L (ref 3.5–5.1)
SODIUM: 139 meq/L (ref 135–145)

## 2016-03-27 LAB — HEPATIC FUNCTION PANEL
ALBUMIN: 4.8 g/dL (ref 3.5–5.2)
ALK PHOS: 38 U/L — AB (ref 39–117)
ALT: 47 U/L (ref 0–53)
AST: 40 U/L — AB (ref 0–37)
BILIRUBIN DIRECT: 0.1 mg/dL (ref 0.0–0.3)
TOTAL PROTEIN: 7.4 g/dL (ref 6.0–8.3)
Total Bilirubin: 0.6 mg/dL (ref 0.2–1.2)

## 2016-03-27 LAB — LIPID PANEL
CHOLESTEROL: 182 mg/dL (ref 0–200)
HDL: 45.6 mg/dL (ref >39.00)
LDL Cholesterol: 113 mg/dL — ABNORMAL HIGH (ref 0–99)
NONHDL: 136.07
Total CHOL/HDL Ratio: 4
Triglycerides: 114 mg/dL (ref 0.0–149.0)
VLDL: 22.8 mg/dL (ref 0.0–40.0)

## 2016-03-27 LAB — HEMOGLOBIN A1C: HEMOGLOBIN A1C: 7.1 % — AB (ref 4.6–6.5)

## 2016-04-01 ENCOUNTER — Ambulatory Visit (INDEPENDENT_AMBULATORY_CARE_PROVIDER_SITE_OTHER): Payer: BC Managed Care – PPO | Admitting: Internal Medicine

## 2016-04-01 ENCOUNTER — Telehealth: Payer: Self-pay

## 2016-04-01 ENCOUNTER — Encounter: Payer: Self-pay | Admitting: Internal Medicine

## 2016-04-01 VITALS — BP 142/82 | HR 94 | Temp 97.8°F | Resp 20 | Wt 265.0 lb

## 2016-04-01 DIAGNOSIS — E119 Type 2 diabetes mellitus without complications: Secondary | ICD-10-CM | POA: Diagnosis not present

## 2016-04-01 DIAGNOSIS — E785 Hyperlipidemia, unspecified: Secondary | ICD-10-CM

## 2016-04-01 DIAGNOSIS — R6889 Other general symptoms and signs: Secondary | ICD-10-CM

## 2016-04-01 DIAGNOSIS — Z0001 Encounter for general adult medical examination with abnormal findings: Secondary | ICD-10-CM

## 2016-04-01 DIAGNOSIS — I10 Essential (primary) hypertension: Secondary | ICD-10-CM

## 2016-04-01 MED ORDER — TAMSULOSIN HCL 0.4 MG PO CAPS: 0.4000 mg | ORAL_CAPSULE | Freq: Every day | ORAL | Status: DC

## 2016-04-01 MED ORDER — HYDROCORTISONE ACE-PRAMOXINE 2.5-1 % RE CREA
TOPICAL_CREAM | Freq: Two times a day (BID) | RECTAL | Status: DC
Start: 2016-04-01 — End: 2016-09-29

## 2016-04-01 MED ORDER — MUPIROCIN 2 % EX OINT
TOPICAL_OINTMENT | Freq: Every day | CUTANEOUS | Status: DC
Start: 2016-04-01 — End: 2016-09-29

## 2016-04-01 MED ORDER — SIMVASTATIN 20 MG PO TABS: 20.0000 mg | ORAL_TABLET | Freq: Every day | ORAL | Status: DC

## 2016-04-01 MED ORDER — METFORMIN HCL ER 500 MG PO TB24: 500.0000 mg | ORAL_TABLET | Freq: Every day | ORAL | Status: DC

## 2016-04-01 MED ORDER — OMEPRAZOLE 20 MG PO CPDR: 20.0000 mg | DELAYED_RELEASE_CAPSULE | Freq: Every day | ORAL | Status: DC

## 2016-04-01 MED ORDER — AMLODIPINE BESY-BENAZEPRIL HCL 5-20 MG PO CAPS: 1.0000 | ORAL_CAPSULE | Freq: Every day | ORAL | Status: DC

## 2016-04-01 MED ORDER — TESTOSTERONE 50 MG/5GM (1%) TD GEL: TRANSDERMAL | Status: DC

## 2016-04-01 NOTE — Patient Instructions (Signed)
Please continue all other medications as before, and refills have been done for all of your "regular" medications today  Please have the pharmacy call with any other refills you may need.  Please continue your efforts at being more active, low cholesterol diet, and weight control.  Please keep your appointments with your specialists as you may have planned  Please return in 6 months, or sooner if needed, with Lab testing done 3-5 days before

## 2016-04-01 NOTE — Progress Notes (Signed)
Subjective:    Patient ID: [name] [name], male    DOB: [birth year], 65 y.o.   MRN: [id number]  HPI  Here to f/u; overall doing ok,  Pt denies chest pain, increasing sob or doe, wheezing, orthopnea, PND, increased LE swelling, palpitations, dizziness or syncope.  Pt denies new neurological symptoms such as new headache, or facial or extremity weakness or numbness.  Pt denies polydipsia, polyuria, or low sugar episode.   Pt denies new neurological symptoms such as new headache, or facial or extremity weakness or numbness.   Pt states overall good compliance with meds, mostly trying to follow appropriate diet, with wt overall stable,  but little exercise however, admits he needs more, plans to do more. Past Medical History  Diagnosis Date  . BACK PAIN 05/28/2010  . BOILS, RECURRENT 11/23/2007  . COLONIC POLYPS, HX OF 11/23/2007  . Cough 01/02/2010  . DIABETES MELLITUS, TYPE II 06/01/2008  . Esophageal reflux 11/23/2007  . HEMORRHOID, EXTERNAL, THROMBOSED 09/11/2010  . HYPERLIPIDEMIA 11/23/2007  . HYPERSOMNIA 07/01/2009  . HYPERTENSION 11/23/2007  . HYPOGONADISM 07/02/2010  . LUMBAR RADICULOPATHY, RIGHT 09/17/2010  . Obesity, unspecified 11/23/2007  . OBSTRUCTIVE SLEEP APNEA 07/11/2009  . Other abnormal glucose 11/22/2007  . Impaired glucose tolerance 07/31/2011  . Allergic rhinitis, cause unspecified 03/24/2013   No past surgical history on file.  reports that he has quit smoking. He does not have any smokeless tobacco history on file. He reports that he drinks alcohol. He reports that he does not use illicit drugs. family history includes Asthma in his sister; Heart disease in his father; Hyperlipidemia in his father; Hypertension in his father. No Known Allergies Current Outpatient Prescriptions on File Prior to Visit  Medication Sig Dispense Refill  . amLODipine-benazepril (LOTREL) 5-20 MG capsule Take 1 capsule by mouth daily. Yearly physical w/labs is due must see MD for refills 90 capsule 0  . aspirin 81  MG tablet Take 81 mg by mouth daily.      . Efinaconazole (JUBLIA) 10 % SOLN Use as directed daily as needed 8 mL 11  . metFORMIN (GLUCOPHAGE-XR) 500 MG 24 hr tablet Take 1 tablet (500 mg total) by mouth daily with breakfast. 90 tablet 3  . nabumetone (RELAFEN) 750 MG tablet Take 1 tablet (750 mg total) by mouth 2 (two) times daily as needed for moderate pain. 180 tablet 1  . predniSONE (DELTASONE) 10 MG tablet 2 tabs by mouth per day for 7 days 14 tablet 0  . tadalafil (CIALIS) 5 MG tablet 1 by mouth per day 90 tablet 3  . valACYclovir (VALTREX) 1000 MG tablet Take 1 tablet (1,000 mg total) by mouth 2 (two) times daily. 60 tablet 5   No current facility-administered medications on file prior to visit.    Wt Readings from Last 3 Encounters:  04/01/16 265 lb (120.203 kg)  10/03/15 262 lb (118.842 kg)  04/05/15 259 lb (117.482 kg)   Review of Systems  Constitutional: Negative for unusual diaphoresis or night sweats HENT: Negative for ear swelling or discharge Eyes: Negative for worsening visual haziness  Respiratory: Negative for choking and stridor.   Gastrointestinal: Negative for distension or worsening eructation Genitourinary: Negative for retention or change in urine volume.  Musculoskeletal: Negative for other MSK pain or swelling Skin: Negative for color change and worsening wound Neurological: Negative for tremors and numbness other than noted  Psychiatric/Behavioral: Negative for decreased concentration or agitation other than above       Objective:  Physical Exam Blood pressure 142/82, pulse 94, temperature 97.8 F (36.6 C), temperature source Oral, resp. rate 20, weight 265 lb (120.203 kg), SpO2 97 %. VS noted,  Constitutional: Pt appears in no apparent distress HENT: Head: NCAT.  Right Ear: External ear normal.  Left Ear: External ear normal.  Eyes: . Pupils are equal, round, and reactive to light. Conjunctivae and EOM are normal Neck: Normal range of motion. Neck  supple.  Cardiovascular: Normal rate and regular rhythm.   Pulmonary/Chest: Effort normal and breath sounds without rales or wheezing.  Abd:  Soft, NT, ND, + BS Neurological: Pt is alert. Not confused , motor grossly intact Skin: Skin is warm. No rash, no LE edema Psychiatric: Pt behavior is normal. No agitation.     Assessment & Plan:

## 2016-04-01 NOTE — Assessment & Plan Note (Signed)
Mild elev, delcines med change for today, to work on diet, wt loss, o/w stable overall by history and exam, recent data reviewed with pt, and pt to continue medical treatment as before,  to f/u any worsening symptoms or concerns BP Readings from Last 3 Encounters:  04/01/16 142/82  10/03/15 142/86  04/05/15 142/88

## 2016-04-01 NOTE — Telephone Encounter (Signed)
Medication refills sent to pharmacy 

## 2016-04-01 NOTE — Assessment & Plan Note (Signed)
Mild uncontrolled, to work on diet, exercise, wt loss, o/w stable overall by history and exam, recent data reviewed with pt, and pt to continue medical treatment as before,  to f/u any worsening symptoms or concerns Lab Results  Component Value Date   LDLCALC 113* 03/27/2016

## 2016-04-01 NOTE — Progress Notes (Signed)
Pre visit review using our clinic review tool, if applicable. No additional management support is needed unless otherwise documented below in the visit note. 

## 2016-04-01 NOTE — Assessment & Plan Note (Addendum)
Mild uncontrolled, to cont same meds for now, to work on diet, exercise, o/w stable overall by history and exam, recent data reviewed with pt, and pt to continue medical treatment as before,  to f/u any worsening symptoms or concerns Lab Results  Component Value Date   HGBA1C 7.1* 03/27/2016

## 2016-05-04 ENCOUNTER — Telehealth: Payer: Self-pay | Admitting: Internal Medicine

## 2016-05-04 NOTE — Telephone Encounter (Signed)
Faxed script back to [facility].../lmb 

## 2016-05-04 NOTE — Telephone Encounter (Signed)
MD out of office until Wed pls advise on refill..Marland Kitchen

## 2016-05-08 NOTE — Telephone Encounter (Signed)
Please advise 

## 2016-05-08 NOTE — Telephone Encounter (Signed)
Rx was faxed on 6/19.Marland Kitchen.Marland Kitchen.Called pharmacy spoke w/Christina gave md authorization from Mon.../lmb

## 2016-05-08 NOTE — Telephone Encounter (Signed)
This pt called in and said that [facility] did not get refill for this .  He is upset because he is stating that it is taking to long to get his refills.  Just fyi.

## 2016-07-16 ENCOUNTER — Other Ambulatory Visit: Payer: Self-pay | Admitting: Internal Medicine

## 2016-09-29 ENCOUNTER — Telehealth: Payer: Self-pay

## 2016-09-29 ENCOUNTER — Encounter: Payer: Self-pay | Admitting: Internal Medicine

## 2016-09-29 ENCOUNTER — Ambulatory Visit (INDEPENDENT_AMBULATORY_CARE_PROVIDER_SITE_OTHER): Payer: BC Managed Care – PPO | Admitting: Internal Medicine

## 2016-09-29 ENCOUNTER — Other Ambulatory Visit (INDEPENDENT_AMBULATORY_CARE_PROVIDER_SITE_OTHER): Payer: BC Managed Care – PPO

## 2016-09-29 VITALS — BP 138/80 | HR 103 | Temp 97.9°F | Resp 20 | Wt 266.2 lb

## 2016-09-29 DIAGNOSIS — Z0000 Encounter for general adult medical examination without abnormal findings: Secondary | ICD-10-CM

## 2016-09-29 DIAGNOSIS — E785 Hyperlipidemia, unspecified: Secondary | ICD-10-CM

## 2016-09-29 DIAGNOSIS — I10 Essential (primary) hypertension: Secondary | ICD-10-CM

## 2016-09-29 DIAGNOSIS — Z0001 Encounter for general adult medical examination with abnormal findings: Secondary | ICD-10-CM

## 2016-09-29 DIAGNOSIS — E119 Type 2 diabetes mellitus without complications: Secondary | ICD-10-CM | POA: Diagnosis not present

## 2016-09-29 LAB — CBC WITH DIFFERENTIAL/PLATELET
BASOS ABS: 0 10*3/uL (ref 0.0–0.1)
Basophils Relative: 0.4 % (ref 0.0–3.0)
EOS ABS: 0.1 10*3/uL (ref 0.0–0.7)
EOS PCT: 1.8 % (ref 0.0–5.0)
HCT: 43.4 % (ref 39.0–52.0)
Hemoglobin: 14.8 g/dL (ref 13.0–17.0)
LYMPHS ABS: 2.4 10*3/uL (ref 0.7–4.0)
Lymphocytes Relative: 41.9 % (ref 12.0–46.0)
MCHC: 34 g/dL (ref 30.0–36.0)
MCV: 87.5 fl (ref 78.0–100.0)
MONO ABS: 0.4 10*3/uL (ref 0.1–1.0)
Monocytes Relative: 6.7 % (ref 3.0–12.0)
NEUTROS PCT: 49.2 % (ref 43.0–77.0)
Neutro Abs: 2.8 10*3/uL (ref 1.4–7.7)
Platelets: 288 10*3/uL (ref 150.0–400.0)
RBC: 4.96 Mil/uL (ref 4.22–5.81)
RDW: 13.4 % (ref 11.5–15.5)
WBC: 5.7 10*3/uL (ref 4.0–10.5)

## 2016-09-29 LAB — URINALYSIS, ROUTINE W REFLEX MICROSCOPIC
Bilirubin Urine: NEGATIVE
HGB URINE DIPSTICK: NEGATIVE
Ketones, ur: NEGATIVE
LEUKOCYTES UA: NEGATIVE
Nitrite: NEGATIVE
RBC / HPF: NONE SEEN (ref 0–?)
Specific Gravity, Urine: 1.02 (ref 1.000–1.030)
TOTAL PROTEIN, URINE-UPE24: NEGATIVE
URINE GLUCOSE: NEGATIVE
Urobilinogen, UA: 0.2 (ref 0.0–1.0)
pH: 6 (ref 5.0–8.0)

## 2016-09-29 LAB — HEPATIC FUNCTION PANEL
ALK PHOS: 38 U/L — AB (ref 39–117)
ALT: 49 U/L (ref 0–53)
AST: 40 U/L — AB (ref 0–37)
Albumin: 4.7 g/dL (ref 3.5–5.2)
BILIRUBIN DIRECT: 0.1 mg/dL (ref 0.0–0.3)
BILIRUBIN TOTAL: 0.5 mg/dL (ref 0.2–1.2)
Total Protein: 7.3 g/dL (ref 6.0–8.3)

## 2016-09-29 LAB — PSA: PSA: 7.68 ng/mL — ABNORMAL HIGH (ref 0.10–4.00)

## 2016-09-29 LAB — LIPID PANEL
CHOLESTEROL: 218 mg/dL — AB (ref 0–200)
HDL: 56.4 mg/dL (ref >39.00)
LDL Cholesterol: 136 mg/dL — ABNORMAL HIGH (ref 0–99)
NonHDL: 161.11
Total CHOL/HDL Ratio: 4
Triglycerides: 127 mg/dL (ref 0.0–149.0)
VLDL: 25.4 mg/dL (ref 0.0–40.0)

## 2016-09-29 LAB — BASIC METABOLIC PANEL
BUN: 13 mg/dL (ref 6–23)
CALCIUM: 9.8 mg/dL (ref 8.4–10.5)
CO2: 31 mEq/L (ref 19–32)
CREATININE: 1.09 mg/dL (ref 0.40–1.50)
Chloride: 103 mEq/L (ref 96–112)
GFR: 72.58 mL/min (ref >60.00)
GLUCOSE: 150 mg/dL — AB (ref 70–99)
Potassium: 4.5 mEq/L (ref 3.5–5.1)
Sodium: 140 mEq/L (ref 135–145)

## 2016-09-29 LAB — MICROALBUMIN / CREATININE URINE RATIO
CREATININE, U: 174.6 mg/dL
MICROALB UR: 5.3 mg/dL — AB (ref 0.0–1.9)
Microalb Creat Ratio: 3 mg/g (ref 0.0–30.0)

## 2016-09-29 LAB — HEMOGLOBIN A1C: HEMOGLOBIN A1C: 7.4 % — AB (ref 4.6–6.5)

## 2016-09-29 LAB — TSH: TSH: 1.52 u[IU]/mL (ref 0.35–4.50)

## 2016-09-29 MED ORDER — TESTOSTERONE 50 MG/5GM (1%) TD GEL: TRANSDERMAL | 0 refills | Status: DC

## 2016-09-29 MED ORDER — MUPIROCIN 2 % EX OINT: TOPICAL_OINTMENT | Freq: Every day | CUTANEOUS | 2 refills | Status: AC

## 2016-09-29 MED ORDER — METFORMIN HCL ER 500 MG PO TB24
500.0000 mg | ORAL_TABLET | Freq: Every day | ORAL | 3 refills | Status: DC
Start: 2016-09-29 — End: 2016-09-30

## 2016-09-29 MED ORDER — SIMVASTATIN 20 MG PO TABS: 20.0000 mg | ORAL_TABLET | Freq: Every day | ORAL | 3 refills | Status: DC

## 2016-09-29 MED ORDER — TAMSULOSIN HCL 0.4 MG PO CAPS: 0.4000 mg | ORAL_CAPSULE | Freq: Every day | ORAL | 3 refills | Status: DC

## 2016-09-29 MED ORDER — NABUMETONE 750 MG PO TABS: 750.0000 mg | ORAL_TABLET | Freq: Two times a day (BID) | ORAL | 1 refills | Status: DC | PRN

## 2016-09-29 MED ORDER — OMEPRAZOLE 20 MG PO CPDR: 20.0000 mg | DELAYED_RELEASE_CAPSULE | Freq: Every day | ORAL | 3 refills | Status: DC

## 2016-09-29 MED ORDER — TADALAFIL 5 MG PO TABS: ORAL_TABLET | ORAL | 3 refills | Status: DC

## 2016-09-29 MED ORDER — HYDROCORTISONE ACE-PRAMOXINE 2.5-1 % RE CREA: TOPICAL_CREAM | Freq: Two times a day (BID) | RECTAL | 2 refills | Status: DC

## 2016-09-29 MED ORDER — METFORMIN HCL ER 500 MG PO TB24: 500.0000 mg | ORAL_TABLET | Freq: Every day | ORAL | 3 refills | Status: DC

## 2016-09-29 MED ORDER — AMLODIPINE BESY-BENAZEPRIL HCL 5-20 MG PO CAPS: 1.0000 | ORAL_CAPSULE | Freq: Every day | ORAL | 3 refills | Status: DC

## 2016-09-29 NOTE — Progress Notes (Signed)
Subjective:    Patient ID: [name] [name], male    DOB: May 21, 1951, 65 y.o.   MRN: [id number]  HPI Here for wellness and f/u;  Overall doing ok;  Pt denies Chest pain, worsening SOB, DOE, wheezing, orthopnea, PND, worsening LE edema, palpitations, dizziness or syncope.  Pt denies neurological change such as new headache, facial or extremity weakness.  Pt denies polydipsia, polyuria, or low sugar symptoms. Pt states overall good compliance with treatment and medications, good tolerability, and has been trying to follow appropriate diet.  Pt denies worsening depressive symptoms, suicidal ideation or panic. No fever, night sweats, wt loss, loss of appetite, or other constitutional symptoms.  Pt states good ability with ADL's, has low fall risk, home safety reviewed and adequate, no other significant changes in hearing or vision, and only occasionally active with exercise.  No other history changes except out of metformin x 1 mo as office would not refill Wt Readings from Last 3 Encounters:  09/29/16 266 lb 4 oz (120.8 kg)  04/01/16 265 lb (120.2 kg)  10/03/15 262 lb (118.8 kg)   Past Medical History:  Diagnosis Date  . Allergic rhinitis, cause unspecified 03/24/2013  . BACK PAIN 05/28/2010  . BOILS, RECURRENT 11/23/2007  . COLONIC POLYPS, HX OF 11/23/2007  . Cough 01/02/2010  . DIABETES MELLITUS, TYPE II 06/01/2008  . Esophageal reflux 11/23/2007  . HEMORRHOID, EXTERNAL, THROMBOSED 09/11/2010  . HYPERLIPIDEMIA 11/23/2007  . HYPERSOMNIA 07/01/2009  . HYPERTENSION 11/23/2007  . HYPOGONADISM 07/02/2010  . Impaired glucose tolerance 07/31/2011  . LUMBAR RADICULOPATHY, RIGHT 09/17/2010  . Obesity, unspecified 11/23/2007  . OBSTRUCTIVE SLEEP APNEA 07/11/2009  . Other abnormal glucose 11/22/2007   No past surgical history on file.  reports that he has quit smoking. He does not have any smokeless tobacco history on file. He reports that he drinks alcohol. He reports that he does not use drugs. family history includes  Asthma in his sister; Heart disease in his father; Hyperlipidemia in his father; Hypertension in his father. No Known Allergies Current Outpatient Prescriptions on File Prior to Visit  Medication Sig Dispense Refill  . aspirin 81 MG tablet Take 81 mg by mouth daily.      . Efinaconazole (JUBLIA) 10 % SOLN Use as directed daily as needed 8 mL 11  . valACYclovir (VALTREX) 1000 MG tablet Take 1 tablet (1,000 mg total) by mouth 2 (two) times daily. 60 tablet 5   No current facility-administered medications on file prior to visit.    Review of Systems Constitutional: Negative for increased diaphoresis, or other activity, appetite or siginficant weight change other than noted HENT: Negative for worsening hearing loss, ear pain, facial swelling, mouth sores and neck stiffness.   Eyes: Negative for other worsening pain, redness or visual disturbance.  Respiratory: Negative for choking or stridor Cardiovascular: Negative for other chest pain and palpitations.  Gastrointestinal: Negative for worsening diarrhea, blood in stool, or abdominal distention Genitourinary: Negative for hematuria, flank pain or change in urine volume.  Musculoskeletal: Negative for myalgias or other joint complaints.  Skin: Negative for other color change and wound or drainage.  Neurological: Negative for syncope and numbness. other than noted Hematological: Negative for adenopathy. or other swelling Psychiatric/Behavioral: Negative for hallucinations, SI, self-injury, decreased concentration or other worsening agitation.  All other system neg per pt    Objective:   Physical Exam BP 138/80   Pulse (!) 103   Temp 97.9 F (36.6 C) (Oral)   Resp 20  Wt 266 lb 4 oz (120.8 kg)   SpO2 97%   BMI 39.32 kg/m  VS noted,  Constitutional: Pt is oriented to person, place, and time. Appears well-developed and well-nourished, in no significant distress Head: Normocephalic and atraumatic  Eyes: Conjunctivae and EOM are normal.  Pupils are equal, round, and reactive to light Right Ear: External ear normal.  Left Ear: External ear normal Nose: Nose normal.  Mouth/Throat: Oropharynx is clear and moist  Neck: Normal range of motion. Neck supple. No JVD present. No tracheal deviation present or significant neck LA or mass Cardiovascular: Normal rate, regular rhythm, normal heart sounds and intact distal pulses.   Pulmonary/Chest: Effort normal and breath sounds without rales or wheezing  Abdominal: Soft. Bowel sounds are normal. NT. No HSM  Musculoskeletal: Normal range of motion. Exhibits no edema Lymphadenopathy: Has no cervical adenopathy.  Neurological: Pt is alert and oriented to person, place, and time. Pt has normal reflexes. No cranial nerve deficit. Motor grossly intact Skin: Skin is warm and dry. No rash noted or new ulcers Psychiatric:  Has normal mood and affect. Behavior is normal.  No other new exam findings    Assessment & Plan:

## 2016-09-29 NOTE — Progress Notes (Signed)
Pre visit review using our clinic review tool, if applicable. No additional management support is needed unless otherwise documented below in the visit note. 

## 2016-09-29 NOTE — Telephone Encounter (Signed)
Medication refill sent to pharmacy  

## 2016-09-29 NOTE — Patient Instructions (Addendum)

## 2016-09-30 ENCOUNTER — Other Ambulatory Visit: Payer: Self-pay | Admitting: Internal Medicine

## 2016-09-30 ENCOUNTER — Encounter: Payer: Self-pay | Admitting: Internal Medicine

## 2016-09-30 DIAGNOSIS — R972 Elevated prostate specific antigen [PSA]: Secondary | ICD-10-CM

## 2016-09-30 MED ORDER — ROSUVASTATIN CALCIUM 20 MG PO TABS: 20.0000 mg | ORAL_TABLET | Freq: Every day | ORAL | 3 refills | Status: DC

## 2016-09-30 MED ORDER — METFORMIN HCL ER 500 MG PO TB24: 1000.0000 mg | ORAL_TABLET | Freq: Every day | ORAL | 3 refills | Status: DC

## 2016-10-04 NOTE — Assessment & Plan Note (Signed)
Out of metformin for 1 mo, for restart and f/u a1c today

## 2016-10-04 NOTE — Assessment & Plan Note (Signed)
stable overall by history and exam, recent data reviewed with pt, and pt to continue medical treatment as before,  to f/u any worsening symptoms or concerns BP Readings from Last 3 Encounters:  09/29/16 138/80  04/01/16 (!) 142/82  10/03/15 (!) 142/86

## 2016-10-04 NOTE — Assessment & Plan Note (Signed)
stable overall by history and exam, recent data reviewed with pt, and pt to continue medical treatment as before,  to f/u any worsening symptoms or concerns; for restart statin

## 2016-10-04 NOTE — Assessment & Plan Note (Signed)

## 2017-01-04 ENCOUNTER — Telehealth: Payer: Self-pay | Admitting: *Deleted

## 2017-01-04 NOTE — Telephone Encounter (Signed)
Left msg on triage stating MD had rx testosterone and the insurance denied. Was told to find out what his insurance would cover and let MD know. Pt states they wll cover the Axiron and AndroGel (brand name) pls advise which MD prefer...Raechel Chute/lmb

## 2017-01-05 MED ORDER — TESTOSTERONE 50 MG/5GM (1%) TD GEL: TRANSDERMAL | 1 refills | Status: DC

## 2017-01-05 NOTE — Telephone Encounter (Signed)
Ok for brand name androgel as apparently this is covered when the generic is not, per prior note  Done hardcopy to [place]

## 2017-01-06 NOTE — Telephone Encounter (Signed)
Notified pt MD sent Andro Gel [facility] to [facility]...Raechel Chute/lmb

## 2017-01-11 MED ORDER — TESTOSTERONE 10 MG/ACT (2%) TD GEL: TRANSDERMAL | 1 refills | Status: DC

## 2017-01-11 NOTE — Addendum Note (Signed)
Addended by: Deatra JamesBRAND, LUCY M on: 01/11/2017 11:38 AM   Modules accepted: Orders

## 2017-01-11 NOTE — Telephone Encounter (Signed)
Rec'd fax stating testosterone script must be 2% gel for insurance to cover. Updated med list and faxed back to [facility]...Raechel Chute/lmb

## 2017-01-12 MED ORDER — TESTOSTERONE 50 MG/5GM (1%) TD GEL: TRANSDERMAL | 0 refills | Status: DC

## 2017-01-12 NOTE — Telephone Encounter (Signed)
Rec'd fax stating needing to verify if testosterone received script for 2% packets pt was taking 1%. Updated rx faxed back new script for the Testosterone 1%...Raechel Chute/lmb

## 2017-01-12 NOTE — Telephone Encounter (Signed)
Rec'd msg stating PA has been sent to [facility] w/ PA Case ID: [id number] - Rx #: [id number]...Raechel Chute/lmb

## 2017-01-12 NOTE — Telephone Encounter (Signed)
Error received PA for Testerone 50 mg/5gm 1%. Completed PA fax back to [facility]...Raechel Chute/lmb

## 2017-01-12 NOTE — Addendum Note (Signed)
Addended by: Deatra JamesBRAND, Zakariah Dejarnette M on: 01/12/2017 03:17 PM   Modules accepted: Orders

## 2017-01-18 NOTE — Telephone Encounter (Signed)
Rec'd PA med has been approved for following dates 01/15/17-01/16/20...Raechel Chute/lmb

## 2017-02-23 ENCOUNTER — Ambulatory Visit (INDEPENDENT_AMBULATORY_CARE_PROVIDER_SITE_OTHER): Payer: BC Managed Care – PPO | Admitting: Internal Medicine

## 2017-02-23 ENCOUNTER — Other Ambulatory Visit (INDEPENDENT_AMBULATORY_CARE_PROVIDER_SITE_OTHER): Payer: BC Managed Care – PPO

## 2017-02-23 ENCOUNTER — Encounter: Payer: Self-pay | Admitting: Internal Medicine

## 2017-02-23 VITALS — BP 106/62 | HR 97 | Temp 98.8°F | Ht 70.0 in | Wt 264.0 lb

## 2017-02-23 DIAGNOSIS — E785 Hyperlipidemia, unspecified: Secondary | ICD-10-CM

## 2017-02-23 DIAGNOSIS — H1013 Acute atopic conjunctivitis, bilateral: Secondary | ICD-10-CM

## 2017-02-23 DIAGNOSIS — E119 Type 2 diabetes mellitus without complications: Secondary | ICD-10-CM

## 2017-02-23 DIAGNOSIS — R972 Elevated prostate specific antigen [PSA]: Secondary | ICD-10-CM | POA: Diagnosis not present

## 2017-02-23 DIAGNOSIS — J309 Allergic rhinitis, unspecified: Secondary | ICD-10-CM

## 2017-02-23 DIAGNOSIS — Z0001 Encounter for general adult medical examination with abnormal findings: Secondary | ICD-10-CM

## 2017-02-23 DIAGNOSIS — I10 Essential (primary) hypertension: Secondary | ICD-10-CM | POA: Diagnosis not present

## 2017-02-23 DIAGNOSIS — J069 Acute upper respiratory infection, unspecified: Secondary | ICD-10-CM | POA: Diagnosis not present

## 2017-02-23 DIAGNOSIS — H1010 Acute atopic conjunctivitis, unspecified eye: Secondary | ICD-10-CM | POA: Insufficient documentation

## 2017-02-23 LAB — BASIC METABOLIC PANEL
BUN: 14 mg/dL (ref 6–23)
CALCIUM: 9.9 mg/dL (ref 8.4–10.5)
CO2: 29 mEq/L (ref 19–32)
CREATININE: 1.06 mg/dL (ref 0.40–1.50)
Chloride: 102 mEq/L (ref 96–112)
GFR: 74.86 mL/min (ref >60.00)
Glucose, Bld: 165 mg/dL — ABNORMAL HIGH (ref 70–99)
Potassium: 4.6 mEq/L (ref 3.5–5.1)
Sodium: 138 mEq/L (ref 135–145)

## 2017-02-23 LAB — LIPID PANEL
CHOLESTEROL: 200 mg/dL (ref 0–200)
HDL: 55.1 mg/dL (ref >39.00)
LDL CALC: 117 mg/dL — AB (ref 0–99)
NONHDL: 144.46
Total CHOL/HDL Ratio: 4
Triglycerides: 136 mg/dL (ref 0.0–149.0)
VLDL: 27.2 mg/dL (ref 0.0–40.0)

## 2017-02-23 LAB — HEPATIC FUNCTION PANEL
ALK PHOS: 37 U/L — AB (ref 39–117)
ALT: 54 U/L — ABNORMAL HIGH (ref 0–53)
AST: 60 U/L — ABNORMAL HIGH (ref 0–37)
Albumin: 4.7 g/dL (ref 3.5–5.2)
BILIRUBIN DIRECT: 0.1 mg/dL (ref 0.0–0.3)
BILIRUBIN TOTAL: 0.5 mg/dL (ref 0.2–1.2)
Total Protein: 7.3 g/dL (ref 6.0–8.3)

## 2017-02-23 LAB — HEMOGLOBIN A1C: HEMOGLOBIN A1C: 7.7 % — AB (ref 4.6–6.5)

## 2017-02-23 LAB — PSA: PSA: 6.6 ng/mL — ABNORMAL HIGH (ref 0.10–4.00)

## 2017-02-23 MED ORDER — METHYLPREDNISOLONE ACETATE 80 MG/ML IJ SUSP
80.0000 mg | Freq: Once | INTRAMUSCULAR | Status: AC
  Administered 2017-02-23: 80 mg via INTRAMUSCULAR

## 2017-02-23 MED ORDER — ATORVASTATIN CALCIUM 20 MG PO TABS
20.0000 mg | ORAL_TABLET | Freq: Every day | ORAL | 3 refills | Status: DC
Start: 2017-02-23 — End: 2018-03-21

## 2017-02-23 MED ORDER — TRIAMCINOLONE ACETONIDE 55 MCG/ACT NA AERO: 2.0000 | INHALATION_SPRAY | Freq: Every day | NASAL | 12 refills | Status: DC

## 2017-02-23 MED ORDER — AZELASTINE HCL 0.05 % OP SOLN: 2.0000 [drp] | Freq: Two times a day (BID) | OPHTHALMIC | 12 refills | Status: DC

## 2017-02-23 MED ORDER — AZITHROMYCIN 250 MG PO TABS: ORAL_TABLET | ORAL | 1 refills | Status: DC

## 2017-02-23 NOTE — Patient Instructions (Addendum)
You had the steroid shot today  Please take all new medication as prescribed  - the antibiotic, nasacort, and optivar for the eyes  OK to stop the crestor  Please take all new medication as prescribed - the lipitor   Please continue all other medications as before, and refills have been done if requested.  Please have the pharmacy call with any other refills you may need.  Please continue your efforts at being more active, low cholesterol diet, and weight control.  You are otherwise up to date with prevention measures today.  Please keep your appointments with your specialists as you may have planned  Please go to the LAB in the Basement (turn left off the elevator) for the tests to be done today  You will be contacted by phone if any changes need to be made immediately.  Otherwise, you will receive a letter about your results with an explanation, but please check with MyChart first.  Please remember to sign up for MyChart if you have not done so, as this will be important to you in the future with finding out test results, communicating by private email, and scheduling acute appointments online when needed.  Please return in 6 months, or sooner if needed, with Lab testing done 3-5 days before  Good Luck on your Walk in [place].

## 2017-02-23 NOTE — Assessment & Plan Note (Signed)
Mild to mod, for antibx course,  to f/u any worsening symptoms or concerns 

## 2017-02-23 NOTE — Progress Notes (Signed)
Pre visit review using our clinic review tool, if applicable. No additional management support is needed unless otherwise documented below in the visit note. 

## 2017-02-23 NOTE — Assessment & Plan Note (Signed)
Mild to mod, for depomedrol IM 80, nasacort asd,  to f/u any worsening symptoms or concerns, has tried [employer] but too drying

## 2017-02-23 NOTE — Assessment & Plan Note (Signed)
Due to myalgias with crestor, will change to lipitor 20 qd

## 2017-02-23 NOTE — Assessment & Plan Note (Signed)
stable overall by history and exam, recent data reviewed with pt, and pt to continue medical treatment as before,  to f/u any worsening symptoms or concerns Lab Results  Component Value Date   HGBA1C 7.4 (H) 09/29/2016   For f/u lab

## 2017-02-23 NOTE — Assessment & Plan Note (Signed)
aympt, pt is not overly concerned as pSA has been mild elevated several years, will repeat today and next visit

## 2017-02-23 NOTE — Assessment & Plan Note (Signed)
Mild to mod, seasonal worsening, for depomedrol IM 80, optivar asd,  to f/u any worsening symptoms or concerns

## 2017-02-23 NOTE — Progress Notes (Signed)
Subjective:    Patient ID: [name] [name], male    DOB: August 24, 1951, 66 y.o.   MRN: [id number]  HPI  Here with cough persistent x 2 wks mild prod, feels warm but not sure if fever, thinksw may bve more due to allergies. Does have several wks ongoing nasal and eye  allergy symptoms with clearish congestion, itch and sneezing, without fever, pain, ST, cough, swelling or wheezing. Pt denies polydipsia, polyuria, or low sugar symptoms such as weakness or confusion improved with po intake.  Pt states overall good compliance with meds, trying to follow lower cholesterol, diabetic diet, wt overall stable but little exercise however. Pt denies new neurological symptoms such as new headache, or facial or extremity weakness or numbness Now 2nd wk of retirement, has long trek walk in [place] planned for next mo Wt Readings from Last 3 Encounters:  02/23/17 264 lb (119.7 kg)  09/29/16 266 lb 4 oz (120.8 kg)  04/01/16 265 lb (120.2 kg)   BP Readings from Last 3 Encounters:  02/23/17 106/62  09/29/16 138/80  04/01/16 (!) 142/82  Has some myalgias with crestor and asking for change statin.  Did not see urology with last elevated PSA, had to cancel due to work, has not yet rescheduled. Denies urinary symptoms such as dysuria, frequency, urgency, flank pain, hematuria or n/v, fever, chills. Past Medical History:  Diagnosis Date  . Allergic rhinitis, cause unspecified 03/24/2013  . BACK PAIN 05/28/2010  . BOILS, RECURRENT 11/23/2007  . COLONIC POLYPS, HX OF 11/23/2007  . Cough 01/02/2010  . DIABETES MELLITUS, TYPE II 06/01/2008  . Esophageal reflux 11/23/2007  . HEMORRHOID, EXTERNAL, THROMBOSED 09/11/2010  . HYPERLIPIDEMIA 11/23/2007  . HYPERSOMNIA 07/01/2009  . HYPERTENSION 11/23/2007  . HYPOGONADISM 07/02/2010  . Impaired glucose tolerance 07/31/2011  . LUMBAR RADICULOPATHY, RIGHT 09/17/2010  . Obesity, unspecified 11/23/2007  . OBSTRUCTIVE SLEEP APNEA 07/11/2009  . Other abnormal glucose 11/22/2007   No past surgical  history on file.  reports that he has quit smoking. He has never used smokeless tobacco. He reports that he drinks alcohol. He reports that he does not use drugs. family history includes Asthma in his sister; Heart disease in his father; Hyperlipidemia in his father; Hypertension in his father. Allergies  Allergen Reactions  . Crestor [Rosuvastatin] Other (See Comments)    myalgias   Current Outpatient Prescriptions on File Prior to Visit  Medication Sig Dispense Refill  . amLODipine-benazepril (LOTREL) 5-20 MG capsule Take 1 capsule by mouth daily. Yearly physical w/labs is due must see MD for refills 90 capsule 3  . aspirin 81 MG tablet Take 81 mg by mouth daily.      . Efinaconazole (JUBLIA) 10 % SOLN Use as directed daily as needed 8 mL 11  . hydrocortisone-pramoxine (ANALPRAM-HC) 2.5-1 % rectal cream Place rectally 2 (two) times daily. 30 g 2  . metFORMIN (GLUCOPHAGE-XR) 500 MG 24 hr tablet Take 2 tablets (1,000 mg total) by mouth daily with breakfast. 180 tablet 3  . mupirocin ointment (BACTROBAN) 2 % Apply topically daily. 22 g 2  . nabumetone (RELAFEN) 750 MG tablet Take 1 tablet (750 mg total) by mouth 2 (two) times daily as needed for moderate pain. 180 tablet 1  . omeprazole (PRILOSEC) 20 MG capsule Take 1 capsule (20 mg total) by mouth daily. 90 capsule 3  . tadalafil (CIALIS) 5 MG tablet 1 by mouth per day 90 tablet 3  . tamsulosin (FLOMAX) 0.4 MG CAPS capsule Take 1 capsule (0.4 mg  total) by mouth daily. 90 capsule 3  . testosterone (ANDROGEL) 50 MG/5GM (1%) GEL Place 5 g onto the skin daily.    . valACYclovir (VALTREX) 1000 MG tablet Take 1 tablet (1,000 mg total) by mouth 2 (two) times daily. 60 tablet 5   No current facility-administered medications on file prior to visit.    Review of Systems  Constitutional: Negative for other unusual diaphoresis or sweats HENT: Negative for ear discharge or swelling Eyes: Negative for other worsening visual disturbances Respiratory:  Negative for stridor or other swelling  Gastrointestinal: Negative for worsening distension or other blood Genitourinary: Negative for retention or other urinary change Musculoskeletal: Negative for other MSK pain or swelling Skin: Negative for color change or other new lesions Neurological: Negative for worsening tremors and other numbness  Psychiatric/Behavioral: Negative for worsening agitation or other fatigue All other system neg per pt    Objective:   Physical Exam BP 106/62   Pulse 97   Temp 98.8 F (37.1 C) (Oral)   Ht  (1.778 m)   Wt 264 lb (119.7 kg)   SpO2 99%   BMI 37.88 kg/m  VS noted, mild ill appearing Constitutional: Pt appears in NAD HENT: Head: NCAT.  Right Ear: External ear normal.  Left Ear: External ear normal.  Bilat tm's with mild erythema.  Max sinus areas mild tender.  Pharynx with mild erythema, no exudate Eyes: . Pupils are equal, round, and reactive to light.  Conjunctivae with bilat clearish d/c and EOM are normal Nose: without d/c or deformity Neck: Neck supple. Gross normal ROM Cardiovascular: Normal rate and regular rhythm.   Pulmonary/Chest: Effort normal and breath sounds without rales or wheezing.  Neurological: Pt is alert. At baseline orientation, motor grossly intact Skin: Skin is warm. No rashes, other new lesions, no LE edema Psychiatric: Pt behavior is normal without agitation  No other exam findings    Assessment & Plan:

## 2017-02-23 NOTE — Assessment & Plan Note (Signed)
stable overall by history and exam, recent data reviewed with pt, and pt to continue medical treatment as before,  to f/u any worsening symptoms or concerns BP Readings from Last 3 Encounters:  02/23/17 106/62  09/29/16 138/80  04/01/16 (!) 142/82

## 2017-02-28 ENCOUNTER — Other Ambulatory Visit: Payer: Self-pay | Admitting: Internal Medicine

## 2017-03-01 NOTE — Telephone Encounter (Signed)
Routing to dr john, please advise, thanks 

## 2017-06-03 ENCOUNTER — Other Ambulatory Visit: Payer: Self-pay | Admitting: Internal Medicine

## 2017-07-27 ENCOUNTER — Other Ambulatory Visit: Payer: Self-pay | Admitting: Internal Medicine

## 2017-07-27 NOTE — Telephone Encounter (Signed)
Done hardcopy to [name]  

## 2017-07-27 NOTE — Telephone Encounter (Signed)
Faxed

## 2017-09-14 ENCOUNTER — Ambulatory Visit: Payer: BC Managed Care – PPO | Admitting: Internal Medicine

## 2017-09-20 ENCOUNTER — Encounter: Payer: Self-pay | Admitting: Internal Medicine

## 2017-09-20 ENCOUNTER — Ambulatory Visit: Payer: BC Managed Care – PPO | Admitting: Internal Medicine

## 2017-09-20 VITALS — BP 122/78 | HR 105 | Temp 98.5°F | Ht 70.0 in | Wt 259.0 lb

## 2017-09-20 DIAGNOSIS — Z0000 Encounter for general adult medical examination without abnormal findings: Secondary | ICD-10-CM

## 2017-09-20 DIAGNOSIS — E291 Testicular hypofunction: Secondary | ICD-10-CM | POA: Diagnosis not present

## 2017-09-20 DIAGNOSIS — E119 Type 2 diabetes mellitus without complications: Secondary | ICD-10-CM | POA: Diagnosis not present

## 2017-09-20 MED ORDER — TESTOSTERONE 50 MG/5GM (1%) TD GEL: TRANSDERMAL | 1 refills | Status: DC

## 2017-09-20 MED ORDER — VALACYCLOVIR HCL 1 G PO TABS: 1000.0000 mg | ORAL_TABLET | Freq: Two times a day (BID) | ORAL | 5 refills | Status: AC

## 2017-09-20 MED ORDER — OMEPRAZOLE 20 MG PO CPDR: 20.0000 mg | DELAYED_RELEASE_CAPSULE | Freq: Every day | ORAL | 3 refills | Status: DC

## 2017-09-20 MED ORDER — TADALAFIL 5 MG PO TABS: 5.0000 mg | ORAL_TABLET | Freq: Every day | ORAL | 3 refills | Status: DC

## 2017-09-20 MED ORDER — TAMSULOSIN HCL 0.4 MG PO CAPS: 0.4000 mg | ORAL_CAPSULE | Freq: Every day | ORAL | 3 refills | Status: DC

## 2017-09-20 MED ORDER — HYDROCORTISONE ACE-PRAMOXINE 2.5-1 % RE CREA: TOPICAL_CREAM | Freq: Two times a day (BID) | RECTAL | 2 refills | Status: DC

## 2017-09-20 NOTE — Progress Notes (Signed)
Subjective:    Patient ID: [name] [name], male    DOB: [birth year], 66 y.o.   MRN: [id number]  HPI  Here for wellness and f/u;  Overall doing ok;  Pt denies Chest pain, worsening SOB, DOE, wheezing, orthopnea, PND, worsening LE edema, palpitations, dizziness or syncope.  Pt denies neurological change such as new headache, facial or extremity weakness.  Pt denies polydipsia, polyuria, or low sugar symptoms. Pt states overall good compliance with treatment and medications, good tolerability, and has been trying to follow appropriate diet.  Pt denies worsening depressive symptoms, suicidal ideation or panic. No fever, night sweats, wt loss, loss of appetite, or other constitutional symptoms.  Pt states good ability with ADL's, has low fall risk, home safety reviewed and adequate, no other significant changes in hearing or vision, and not active with exercise though  Wt Readings from Last 3 Encounters:  09/20/17 259 lb (117.5 kg)  02/23/17 264 lb (119.7 kg)  09/29/16 266 lb 4 oz (120.8 kg)  Walked 247 km in [place] over 3 wks recently (did not complete the 800 km x 6 wks)..  Retired last spring .  Has not been losing wt due to lots of calories and wine every evening.  Asks for testosterone follow up Past Medical History:  Diagnosis Date  . Allergic rhinitis, cause unspecified 03/24/2013  . BACK PAIN 05/28/2010  . BOILS, RECURRENT 11/23/2007  . COLONIC POLYPS, HX OF 11/23/2007  . Cough 01/02/2010  . DIABETES MELLITUS, TYPE II 06/01/2008  . Esophageal reflux 11/23/2007  . HEMORRHOID, EXTERNAL, THROMBOSED 09/11/2010  . HYPERLIPIDEMIA 11/23/2007  . HYPERSOMNIA 07/01/2009  . HYPERTENSION 11/23/2007  . HYPOGONADISM 07/02/2010  . Impaired glucose tolerance 07/31/2011  . LUMBAR RADICULOPATHY, RIGHT 09/17/2010  . Obesity, unspecified 11/23/2007  . OBSTRUCTIVE SLEEP APNEA 07/11/2009  . Other abnormal glucose 11/22/2007   History reviewed. No pertinent surgical history.  reports that he has quit smoking. he has never used  smokeless tobacco. He reports that he drinks alcohol. He reports that he does not use drugs. family history includes Asthma in his sister; Heart disease in his father; Hyperlipidemia in his father; Hypertension in his father. Allergies  Allergen Reactions  . Crestor [Rosuvastatin] Other (See Comments)    myalgias   Current Outpatient Medications on File Prior to Visit  Medication Sig Dispense Refill  . amLODipine-benazepril (LOTREL) 5-20 MG capsule Take 1 capsule by mouth daily. Yearly physical w/labs is due must see MD for refills 90 capsule 3  . aspirin 81 MG tablet Take 81 mg by mouth daily.      Marland Kitchen atorvastatin (LIPITOR) 20 MG tablet Take 1 tablet (20 mg total) by mouth daily. 90 tablet 3  . azelastine (OPTIVAR) 0.05 % ophthalmic solution Place 2 drops into both eyes 2 (two) times daily. 6 mL 12  . Efinaconazole (JUBLIA) 10 % SOLN Use as directed daily as needed 8 mL 11  . metFORMIN (GLUCOPHAGE-XR) 500 MG 24 hr tablet Take 2 tablets (1,000 mg total) by mouth daily with breakfast. 180 tablet 3  . mupirocin ointment (BACTROBAN) 2 % Apply topically daily. 22 g 2  . nabumetone (RELAFEN) 750 MG tablet TAKE 1 TABLET(750 MG) BY MOUTH TWICE DAILY AS NEEDED FOR MODERATE PAIN 180 tablet 1  . triamcinolone (NASACORT AQ) 55 MCG/ACT AERO nasal inhaler Place 2 sprays into the nose daily. 1 Inhaler 12   No current facility-administered medications on file prior to visit.    Review of Systems Constitutional: Negative for other unusual  diaphoresis, sweats, appetite or weight changes HENT: Negative for other worsening hearing loss, ear pain, facial swelling, mouth sores or neck stiffness.   Eyes: Negative for other worsening pain, redness or other visual disturbance.  Respiratory: Negative for other stridor or swelling Cardiovascular: Negative for other palpitations or other chest pain  Gastrointestinal: Negative for worsening diarrhea or loose stools, blood in stool, distention or other  pain Genitourinary: Negative for hematuria, flank pain or other change in urine volume.  Musculoskeletal: Negative for myalgias or other joint swelling.  Skin: Negative for other color change, or other wound or worsening drainage.  Neurological: Negative for other syncope or numbness. Hematological: Negative for other adenopathy or swelling Psychiatric/Behavioral: Negative for hallucinations, other worsening agitation, SI, self-injury, or new decreased concentration All other system neg per pt    Objective:   Physical Exam BP 122/78   Pulse (!) 105   Temp 98.5 F (36.9 C) (Oral)   Ht 5\' 10"  (1.778 m)   Wt 259 lb (117.5 kg)   SpO2 98%   BMI 37.16 kg/m  VS noted,  Constitutional: Pt is oriented to person, place, and time. Appears well-developed and well-nourished, in no significant distress and comfortable Head: Normocephalic and atraumatic  Eyes: Conjunctivae and EOM are normal. Pupils are equal, round, and reactive to light Right Ear: External ear normal without discharge Left Ear: External ear normal without discharge Nose: Nose without discharge or deformity Mouth/Throat: Oropharynx is without other ulcerations and moist  Neck: Normal range of motion. Neck supple. No JVD present. No tracheal deviation present or significant neck LA or mass Cardiovascular: Normal rate, regular rhythm, normal heart sounds and intact distal pulses.   Pulmonary/Chest: WOB normal and breath sounds without rales or wheezing  Abdominal: Soft. Bowel sounds are normal. NT. No HSM  Musculoskeletal: Normal range of motion. Exhibits no edema Lymphadenopathy: Has no other cervical adenopathy.  Neurological: Pt is alert and oriented to person, place, and time. Pt has normal reflexes. No cranial nerve deficit. Motor grossly intact, Gait intact Skin: Skin is warm and dry. No rash noted or new ulcerations Psychiatric:  Has normal mood and affect. Behavior is normal without agitation No other exam findings     Assessment & Plan:

## 2017-09-20 NOTE — Patient Instructions (Signed)

## 2017-09-22 NOTE — Assessment & Plan Note (Signed)
For f/u lab today 

## 2017-09-22 NOTE — Assessment & Plan Note (Signed)

## 2017-09-22 NOTE — Assessment & Plan Note (Signed)
stable overall by history and exam, recent data reviewed with pt, and pt to continue medical treatment as before,  to f/u any worsening symptoms or concerns, for f/u a1c with labs, goal < 7

## 2017-11-01 ENCOUNTER — Encounter: Payer: Self-pay | Admitting: Internal Medicine

## 2017-11-09 ENCOUNTER — Other Ambulatory Visit: Payer: Self-pay | Admitting: Internal Medicine

## 2017-11-10 ENCOUNTER — Encounter: Payer: Self-pay | Admitting: Internal Medicine

## 2017-11-10 ENCOUNTER — Other Ambulatory Visit: Payer: Self-pay | Admitting: Internal Medicine

## 2018-03-21 ENCOUNTER — Ambulatory Visit: Payer: BC Managed Care – PPO | Admitting: Internal Medicine

## 2018-03-21 ENCOUNTER — Encounter: Payer: Self-pay | Admitting: Internal Medicine

## 2018-03-21 VITALS — BP 132/82 | HR 100 | Temp 98.5°F | Ht 70.0 in | Wt 255.0 lb

## 2018-03-21 DIAGNOSIS — E119 Type 2 diabetes mellitus without complications: Secondary | ICD-10-CM | POA: Diagnosis not present

## 2018-03-21 DIAGNOSIS — E785 Hyperlipidemia, unspecified: Secondary | ICD-10-CM | POA: Diagnosis not present

## 2018-03-21 DIAGNOSIS — R972 Elevated prostate specific antigen [PSA]: Secondary | ICD-10-CM

## 2018-03-21 DIAGNOSIS — I10 Essential (primary) hypertension: Secondary | ICD-10-CM

## 2018-03-21 DIAGNOSIS — E291 Testicular hypofunction: Secondary | ICD-10-CM

## 2018-03-21 MED ORDER — TESTOSTERONE 50 MG/5GM (1%) TD GEL: TRANSDERMAL | 1 refills | Status: DC

## 2018-03-21 MED ORDER — METFORMIN HCL ER 500 MG PO TB24: ORAL_TABLET | ORAL | 3 refills | Status: AC

## 2018-03-21 MED ORDER — OMEPRAZOLE 20 MG PO CPDR: 20.0000 mg | DELAYED_RELEASE_CAPSULE | Freq: Every day | ORAL | 3 refills | Status: DC

## 2018-03-21 MED ORDER — HYDROCORTISONE ACE-PRAMOXINE 2.5-1 % RE CREA: TOPICAL_CREAM | Freq: Two times a day (BID) | RECTAL | 2 refills | Status: AC

## 2018-03-21 MED ORDER — TAMSULOSIN HCL 0.4 MG PO CAPS: 0.4000 mg | ORAL_CAPSULE | Freq: Every day | ORAL | 3 refills | Status: AC

## 2018-03-21 MED ORDER — TADALAFIL 5 MG PO TABS: 5.0000 mg | ORAL_TABLET | Freq: Every day | ORAL | 3 refills | Status: DC

## 2018-03-21 MED ORDER — ROSUVASTATIN CALCIUM 20 MG PO TABS: 20.0000 mg | ORAL_TABLET | Freq: Every day | ORAL | 3 refills | Status: AC

## 2018-03-21 NOTE — Progress Notes (Signed)
Subjective:    Patient ID: [name] [name], male    DOB: 1951/10/23, 67 y.o.   MRN: [id number]  HPI  Here to f/u; overall doing ok,  Pt denies chest pain, increasing sob or doe, wheezing, orthopnea, PND, increased LE swelling, palpitations, dizziness or syncope.  Pt denies new neurological symptoms such as new headache, or facial or extremity weakness or numbness.  Pt denies polydipsia, polyuria, or low sugar episode.  Pt states overall good compliance with meds, mostly trying to follow appropriate diet, with wt overall stable,  but little exercise however. Only taking 3 per day metformin as was not sure about going to 4 per [facility] recommendatoin, but willing to try now. Cannot take lipitor due to cramping, is willing to try the crestor again.  Lost 9 lbs with better diet, plans to start  More exercise soon.  Denies urinary symptoms such as dysuria, frequency, urgency, flank pain, hematuria or n/v, fever, chills. Wt Readings from Last 3 Encounters:  03/21/18 255 lb (115.7 kg)  09/20/17 259 lb (117.5 kg)  02/23/17 264 lb (119.7 kg)  No other new complaints or interval hx Past Medical History:  Diagnosis Date  . Allergic rhinitis, cause unspecified 03/24/2013  . BACK PAIN 05/28/2010  . BOILS, RECURRENT 11/23/2007  . COLONIC POLYPS, HX OF 11/23/2007  . Cough 01/02/2010  . DIABETES MELLITUS, TYPE II 06/01/2008  . Esophageal reflux 11/23/2007  . HEMORRHOID, EXTERNAL, THROMBOSED 09/11/2010  . HYPERLIPIDEMIA 11/23/2007  . HYPERSOMNIA 07/01/2009  . HYPERTENSION 11/23/2007  . HYPOGONADISM 07/02/2010  . Impaired glucose tolerance 07/31/2011  . LUMBAR RADICULOPATHY, RIGHT 09/17/2010  . Obesity, unspecified 11/23/2007  . OBSTRUCTIVE SLEEP APNEA 07/11/2009  . Other abnormal glucose 11/22/2007   No past surgical history on file.  reports that he has quit smoking. He has never used smokeless tobacco. He reports that he drinks alcohol. He reports that he does not use drugs. family history includes Asthma in his sister; Heart  disease in his father; Hyperlipidemia in his father; Hypertension in his father. Allergies  Allergen Reactions  . Lipitor [Atorvastatin Calcium] Other (See Comments)    Dizzy, muscle cramps   Current Outpatient Medications on File Prior to Visit  Medication Sig Dispense Refill  . amLODipine-benazepril (LOTREL) 5-20 MG capsule TAKE ONE CAPSULE BY MOUTH DAILY 90 capsule 3  . aspirin 81 MG tablet Take 81 mg by mouth daily.      . mupirocin ointment (BACTROBAN) 2 % Apply topically daily. 22 g 2  . valACYclovir (VALTREX) 1000 MG tablet Take 1 tablet (1,000 mg total) 2 (two) times daily by mouth. 60 tablet 5   No current facility-administered medications on file prior to visit.    Review of Systems Constitutional: Negative for other unusual diaphoresis, sweats, appetite or weight changes HENT: Negative for other worsening hearing loss, ear pain, facial swelling, mouth sores or neck stiffness.   Eyes: Negative for other worsening pain, redness or other visual disturbance.  Respiratory: Negative for other stridor or swelling Cardiovascular: Negative for other palpitations or other chest pain  Gastrointestinal: Negative for worsening diarrhea or loose stools, blood in stool, distention or other pain Genitourinary: Negative for hematuria, flank pain or other change in urine volume.  Musculoskeletal: Negative for myalgias or other joint swelling.  Skin: Negative for other color change, or other wound or worsening drainage.  Neurological: Negative for other syncope or numbness. Hematological: Negative for other adenopathy or swelling Psychiatric/Behavioral: Negative for hallucinations, other worsening agitation, SI, self-injury, or new decreased  concentration All other system neg per pt    Objective:   Physical Exam BP 132/82   Pulse 100   Temp 98.5 F (36.9 C) (Oral)   Ht  (1.778 m)   Wt 255 lb (115.7 kg)   SpO2 96%   BMI 36.59 kg/m  VS noted, morbid obese Constitutional: Pt is  oriented to person, place, and time. Appears well-developed and well-nourished, in no significant distress and comfortable Head: Normocephalic and atraumatic  Eyes: Conjunctivae and EOM are normal. Pupils are equal, round, and reactive to light Right Ear: External ear normal without discharge Left Ear: External ear normal without discharge Nose: Nose without discharge or deformity Mouth/Throat: Oropharynx is without other ulcerations and moist  Neck: Normal range of motion. Neck supple. No JVD present. No tracheal deviation present or significant neck LA or mass Cardiovascular: Normal rate, regular rhythm, normal heart sounds and intact distal pulses.   Pulmonary/Chest: WOB normal and breath sounds without rales or wheezing  Abdominal: Soft. Bowel sounds are normal. NT. No HSM  Musculoskeletal: Normal range of motion. Exhibits no edema Lymphadenopathy: Has no other cervical adenopathy.  Neurological: Pt is alert and oriented to person, place, and time. Pt has normal reflexes. No cranial nerve deficit. Motor grossly intact, Gait intact Skin: Skin is warm and dry. No rash noted or new ulcerations Psychiatric:  Has normal mood and affect. Behavior is normal without agitation No other exam findings    Assessment & Plan:

## 2018-03-21 NOTE — Assessment & Plan Note (Signed)
Cont testosterone replacement, for f/u lab next visit

## 2018-03-21 NOTE — Assessment & Plan Note (Signed)
Declines f/u today, for f/u lab next visit

## 2018-03-21 NOTE — Assessment & Plan Note (Addendum)
Lab Results  Component Value Date   LDLCALC 117 (H) 02/23/2017  stable overall by history and exam, recent data reviewed with pt, and pt to continue medical treatment as before except to stop the lipitor he does not want and retry crestor asd,  to f/u any worsening symptoms or concerns

## 2018-03-21 NOTE — Patient Instructions (Signed)
Ok to increase the metformin ER 500 mg to 4 pills per day  OK to change the lipitor to Crestor 20 mg per day  Please consider making appt in this office (at the checkout desk) for Dr Smith/sports medicine for your feet  Please continue all other medications as before, and refills have been done if requested.  Please have the pharmacy call with any other refills you may need.  Please continue your efforts at being more active, low cholesterol diet, and weight control.  Please keep your appointments with your specialists as you may have planned  Please return in 6 months, or sooner if needed, with Lab testing done 3-5 days before

## 2018-03-21 NOTE — Assessment & Plan Note (Addendum)
Lab Results  Component Value Date   HGBA1C 7.7 (H) 02/23/2017  stable overall by history and exam, recent data reviewed with pt, and pt to continue medical treatment as before and reinforced the need to take 4 tabs per day,  to f/u any worsening symptoms or concerns

## 2018-03-21 NOTE — Assessment & Plan Note (Signed)
BP Readings from Last 3 Encounters:  03/21/18 132/82  09/20/17 122/78  02/23/17 106/62  stable overall by history and exam, recent data reviewed with pt, and pt to continue medical treatment as before,  to f/u any worsening symptoms or concerns

## 2018-04-18 ENCOUNTER — Other Ambulatory Visit: Payer: Self-pay | Admitting: Internal Medicine

## 2018-08-16 ENCOUNTER — Other Ambulatory Visit: Payer: Self-pay | Admitting: Internal Medicine

## 2018-08-16 NOTE — Telephone Encounter (Signed)
Done erx 

## 2018-09-22 ENCOUNTER — Ambulatory Visit: Payer: BC Managed Care – PPO | Admitting: Internal Medicine

## 2018-10-04 ENCOUNTER — Encounter: Payer: Self-pay | Admitting: Internal Medicine

## 2018-10-04 ENCOUNTER — Ambulatory Visit: Payer: Medicare Other | Admitting: Internal Medicine

## 2018-10-04 VITALS — BP 138/84 | HR 100 | Temp 98.6°F | Ht 70.0 in | Wt 259.0 lb

## 2018-10-04 DIAGNOSIS — Z0000 Encounter for general adult medical examination without abnormal findings: Secondary | ICD-10-CM | POA: Diagnosis not present

## 2018-10-04 DIAGNOSIS — E119 Type 2 diabetes mellitus without complications: Secondary | ICD-10-CM

## 2018-10-04 NOTE — Assessment & Plan Note (Signed)

## 2018-10-04 NOTE — Progress Notes (Signed)
Subjective:    Patient ID: [name] [name], male    DOB: 1951-06-16, 67 y.o.   MRN: [id number]  HPI  Here for wellness and f/u;  Overall doing ok;  Pt denies Cp, worsening SOB, DOE, wheezing, orthopnea, PND, worsening LE edema, palpitations, dizziness or syncope.  Pt denies neurological change such as new headache, facial or extremity weakness.  Pt denies polydipsia, polyuria, or low sugar symptoms. Pt states overall good compliance with treatment and medications, good tolerability, and has been trying to follow appropriate diet.  Pt denies worsening depressive symptoms, suicidal ideation or panic. No fever, night sweats, wt loss, loss of appetite, or other constitutional symptoms.  Pt states good ability with ADL's, has low fall risk, home safety reviewed and adequate, no other significant changes in hearing or vision, and only occasionally active with exercise.   Wt Readings from Last 3 Encounters:  10/04/18 259 lb (117.5 kg)  03/21/18 255 lb (115.7 kg)  09/20/17 259 lb (117.5 kg)   Past Medical History:  Diagnosis Date  . Allergic rhinitis, cause unspecified 03/24/2013  . BACK PAIN 05/28/2010  . BOILS, RECURRENT 11/23/2007  . COLONIC POLYPS, HX OF 11/23/2007  . Cough 01/02/2010  . DIABETES MELLITUS, TYPE II 06/01/2008  . Esophageal reflux 11/23/2007  . HEMORRHOID, EXTERNAL, THROMBOSED 09/11/2010  . HYPERLIPIDEMIA 11/23/2007  . HYPERSOMNIA 07/01/2009  . HYPERTENSION 11/23/2007  . HYPOGONADISM 07/02/2010  . Impaired glucose tolerance 07/31/2011  . LUMBAR RADICULOPATHY, RIGHT 09/17/2010  . Obesity, unspecified 11/23/2007  . OBSTRUCTIVE SLEEP APNEA 07/11/2009  . Other abnormal glucose 11/22/2007   No past surgical history on file.  reports that he has quit smoking. He has never used smokeless tobacco. He reports that he drinks alcohol. He reports that he does not use drugs. family history includes Asthma in his sister; Heart disease in his father; Hyperlipidemia in his father; Hypertension in his  father. Allergies  Allergen Reactions  . Lipitor [Atorvastatin Calcium] Other (See Comments)    Dizzy, muscle cramps   Current Outpatient Medications on File Prior to Visit  Medication Sig Dispense Refill  . amLODipine-benazepril (LOTREL) 5-20 MG capsule TAKE ONE CAPSULE BY MOUTH DAILY 90 capsule 3  . aspirin 81 MG tablet Take 81 mg by mouth daily.      . hydrocortisone-pramoxine (ANALPRAM-HC) 2.5-1 % rectal cream Place rectally 2 (two) times daily. 30 g 2  . metFORMIN (GLUCOPHAGE-XR) 500 MG 24 hr tablet TAKE 2 TABLETS(1000 MG) BY MOUTH DAILY WITH BREAKFAST 360 tablet 3  . metFORMIN (GLUCOPHAGE-XR) 500 MG 24 hr tablet TAKE 2 TABLETS(1000 MG) BY MOUTH DAILY WITH BREAKFAST 180 tablet 1  . mupirocin ointment (BACTROBAN) 2 % Apply topically daily. 22 g 2  . omeprazole (PRILOSEC) 20 MG capsule Take 1 capsule (20 mg total) by mouth daily. 90 capsule 3  . rosuvastatin (CRESTOR) 20 MG tablet Take 1 tablet (20 mg total) by mouth daily. 90 tablet 3  . sitaGLIPtin (JANUVIA) 100 MG tablet Take 100 mg by mouth daily.    . tadalafil (CIALIS) 5 MG tablet Take 1 tablet (5 mg total) by mouth daily. 90 tablet 3  . tamsulosin (FLOMAX) 0.4 MG CAPS capsule Take 1 capsule (0.4 mg total) by mouth daily. 90 capsule 3  . testosterone (ANDROGEL) 50 MG/5GM (1%) GEL APPLY EXTERNALLY TO SPECIFIC AREA OF SKIN EVERY DAY 450 g 0  . valACYclovir (VALTREX) 1000 MG tablet Take 1 tablet (1,000 mg total) 2 (two) times daily by mouth. 60 tablet 5   No  current facility-administered medications on file prior to visit.    Review of Systems Constitutional: Negative for other unusual diaphoresis, sweats, appetite or weight changes HENT: Negative for other worsening hearing loss, ear pain, facial swelling, mouth sores or neck stiffness.   Eyes: Negative for other worsening pain, redness or other visual disturbance.  Respiratory: Negative for other stridor or swelling Cardiovascular: Negative for other palpitations or other chest  pain  Gastrointestinal: Negative for worsening diarrhea or loose stools, blood in stool, distention or other pain Genitourinary: Negative for hematuria, flank pain or other change in urine volume.  Musculoskeletal: Negative for myalgias or other joint swelling.  Skin: Negative for other color change, or other wound or worsening drainage.  Neurological: Negative for other syncope or numbness. Hematological: Negative for other adenopathy or swelling Psychiatric/Behavioral: Negative for hallucinations, other worsening agitation, SI, self-injury, or new decreased concentration All other system neg per pt    Objective:   Physical Exam BP 138/84   Pulse 100   Temp 98.6 F (37 C) (Oral)   Ht 5\' 10"  (1.778 m)   Wt 259 lb (117.5 kg)   SpO2 95%   BMI 37.16 kg/m  VS noted,  Constitutional: Pt is oriented to person, place, and time. Appears well-developed and well-nourished, in no significant distress and comfortable Head: Normocephalic and atraumatic  Eyes: Conjunctivae and EOM are normal. Pupils are equal, round, and reactive to light Right Ear: External ear normal without discharge Left Ear: External ear normal without discharge Nose: Nose without discharge or deformity Mouth/Throat: Oropharynx is without other ulcerations and moist  Neck: Normal range of motion. Neck supple. No JVD present. No tracheal deviation present or significant neck LA or mass Cardiovascular: Normal rate, regular rhythm, normal heart sounds and intact distal pulses.   Pulmonary/Chest: WOB normal and breath sounds without rales or wheezing  Abdominal: Soft. Bowel sounds are normal. NT. No HSM  Musculoskeletal: Normal range of motion. Exhibits no edema Lymphadenopathy: Has no other cervical adenopathy.  Neurological: Pt is alert and oriented to person, place, and time. Pt has normal reflexes. No cranial nerve deficit. Motor grossly intact, Gait intact Skin: Skin is warm and dry. No rash noted or new  ulcerations Psychiatric:  Has normal mood and affect. Behavior is normal without agitation No other exam findings  Lab Results  Component Value Date   WBC 5.7 09/29/2016   HGB 14.8 09/29/2016   HCT 43.4 09/29/2016   PLT 288.0 09/29/2016   GLUCOSE 165 (H) 02/23/2017   CHOL 200 02/23/2017   TRIG 136.0 02/23/2017   HDL 55.10 02/23/2017   LDLDIRECT 155.9 02/03/2007   LDLCALC 117 (H) 02/23/2017   ALT 54 (H) 02/23/2017   AST 60 (H) 02/23/2017   NA 138 02/23/2017   K 4.6 02/23/2017   CL 102 02/23/2017   CREATININE 1.06 02/23/2017   BUN 14 02/23/2017   CO2 29 02/23/2017   TSH 1.52 09/29/2016   PSA 6.60 (H) 02/23/2017   INR 0.99 11/26/2010   HGBA1C 7.7 (H) 02/23/2017   MICROALBUR 5.3 (H) 09/29/2016   Declines further labs today       Assessment & Plan:

## 2018-10-04 NOTE — Assessment & Plan Note (Signed)
Recent a1c mild elevated and started [place], cont same tx, f/u 6 mo

## 2018-10-04 NOTE — Patient Instructions (Signed)
Please continue all other medications as before, and refills have been done if requested.  Please have the pharmacy call with any other refills you may need.  Please continue your efforts at being more active, low cholesterol diet, and weight control.  You are otherwise up to date with prevention measures today.  Please keep your appointments with your specialists as you may have planned  Please return in 6 months, or sooner if needed, with Lab testing done 3-5 days before  

## 2018-11-22 ENCOUNTER — Other Ambulatory Visit: Payer: Self-pay | Admitting: Internal Medicine

## 2019-04-06 ENCOUNTER — Ambulatory Visit: Payer: Medicare Other | Admitting: Internal Medicine

## 2019-04-06 ENCOUNTER — Other Ambulatory Visit: Payer: Self-pay | Admitting: Internal Medicine

## 2019-05-14 ENCOUNTER — Other Ambulatory Visit: Payer: Self-pay | Admitting: Internal Medicine

## 2019-06-02 ENCOUNTER — Other Ambulatory Visit: Payer: Self-pay | Admitting: Internal Medicine

## 2019-06-02 NOTE — Telephone Encounter (Signed)
Done erx 

## 2019-06-02 NOTE — Telephone Encounter (Signed)
[place] Controlled Database Checked Last filled: 08/17/18 # 450 LOV w/you: 10/04/18 Next appt w/you: None

## 2020-01-24 DIAGNOSIS — Z87891 Personal history of nicotine dependence: Secondary | ICD-10-CM | POA: Diagnosis not present

## 2020-01-24 DIAGNOSIS — E119 Type 2 diabetes mellitus without complications: Secondary | ICD-10-CM | POA: Diagnosis not present

## 2020-01-24 DIAGNOSIS — K760 Fatty (change of) liver, not elsewhere classified: Secondary | ICD-10-CM | POA: Diagnosis not present

## 2020-05-01 DIAGNOSIS — E1165 Type 2 diabetes mellitus with hyperglycemia: Secondary | ICD-10-CM | POA: Diagnosis not present

## 2020-05-01 DIAGNOSIS — E291 Testicular hypofunction: Secondary | ICD-10-CM | POA: Diagnosis not present

## 2020-05-01 DIAGNOSIS — Z6837 Body mass index (BMI) 37.0-37.9, adult: Secondary | ICD-10-CM | POA: Diagnosis not present

## 2020-05-01 DIAGNOSIS — Z87891 Personal history of nicotine dependence: Secondary | ICD-10-CM | POA: Diagnosis not present

## 2020-05-01 DIAGNOSIS — N529 Male erectile dysfunction, unspecified: Secondary | ICD-10-CM | POA: Diagnosis not present

## 2020-05-01 DIAGNOSIS — Z7984 Long term (current) use of oral hypoglycemic drugs: Secondary | ICD-10-CM | POA: Diagnosis not present

## 2020-08-01 DIAGNOSIS — Z125 Encounter for screening for malignant neoplasm of prostate: Secondary | ICD-10-CM | POA: Diagnosis not present

## 2020-08-01 DIAGNOSIS — R972 Elevated prostate specific antigen [PSA]: Secondary | ICD-10-CM | POA: Diagnosis not present

## 2020-08-01 DIAGNOSIS — Z87891 Personal history of nicotine dependence: Secondary | ICD-10-CM | POA: Diagnosis not present

## 2020-08-01 DIAGNOSIS — N400 Enlarged prostate without lower urinary tract symptoms: Secondary | ICD-10-CM | POA: Diagnosis not present

## 2020-08-01 DIAGNOSIS — Z7984 Long term (current) use of oral hypoglycemic drugs: Secondary | ICD-10-CM | POA: Diagnosis not present

## 2020-08-01 DIAGNOSIS — Z23 Encounter for immunization: Secondary | ICD-10-CM | POA: Diagnosis not present

## 2020-08-01 DIAGNOSIS — E119 Type 2 diabetes mellitus without complications: Secondary | ICD-10-CM | POA: Diagnosis not present

## 2020-08-01 DIAGNOSIS — Z79899 Other long term (current) drug therapy: Secondary | ICD-10-CM | POA: Diagnosis not present

## 2020-08-06 DIAGNOSIS — E119 Type 2 diabetes mellitus without complications: Secondary | ICD-10-CM | POA: Diagnosis not present

## 2020-08-06 DIAGNOSIS — H04123 Dry eye syndrome of bilateral lacrimal glands: Secondary | ICD-10-CM | POA: Diagnosis not present
